# Patient Record
Sex: Male | Born: 1975 | Race: Black or African American | Hispanic: No | Marital: Single | Smoking: Former smoker
Health system: Southern US, Community
[De-identification: ages and names within clinical notes are randomized; demographics above are authoritative.]

## PROBLEM LIST (undated history)

## (undated) HISTORY — PX: VASECTOMY: SHX75

---

## 2017-07-31 ENCOUNTER — Ambulatory Visit (HOSPITAL_COMMUNITY)
Admission: EM | Admit: 2017-07-31 | Discharge: 2017-07-31 | Disposition: A | Discharge status: Home / Self Care | Payer: BLUE CROSS/BLUE SHIELD | Attending: Family Medicine | Admitting: Family Medicine

## 2017-07-31 ENCOUNTER — Encounter (HOSPITAL_COMMUNITY): Payer: Self-pay | Admitting: Emergency Medicine

## 2017-07-31 DIAGNOSIS — R109 Unspecified abdominal pain: Secondary | ICD-10-CM | POA: Diagnosis not present

## 2017-07-31 DIAGNOSIS — R1031 Right lower quadrant pain: Secondary | ICD-10-CM | POA: Diagnosis not present

## 2017-07-31 LAB — POCT URINALYSIS DIP (DEVICE)
BILIRUBIN URINE: NEGATIVE
GLUCOSE, UA: NEGATIVE mg/dL
HGB URINE DIPSTICK: NEGATIVE
Ketones, ur: NEGATIVE mg/dL
LEUKOCYTES UA: NEGATIVE
NITRITE: NEGATIVE
Protein, ur: NEGATIVE mg/dL
Specific Gravity, Urine: 1.015 (ref 1.005–1.030)
Urobilinogen, UA: 0.2 mg/dL (ref 0.0–1.0)
pH: 7 (ref 5.0–8.0)

## 2017-07-31 MED ORDER — BACITRACIN ZINC 500 UNIT/GM EX OINT
TOPICAL_OINTMENT | CUTANEOUS | Status: AC
  Filled 2017-07-31: qty 0.9
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
## 2017-07-31 NOTE — ED Triage Notes (Signed)
Intermittent abdominal pain on low right.  Has had this pain for 2 weeks.  Today pain is much worse.  Denies urinary symptoms .  Normal bm this morning.  Patient does not relate worsening or improvement of pain to any behavior

## 2017-07-31 NOTE — Discharge Instructions (Signed)
Urine negative for infection. Right-sided pain could be due to muscle strain, start ibuprofen 800 mg 3 times a day for the next 10 days. Ice/heat compresses. Monitor for any worsening of symptoms, increased abdominal pain, nausea, vomiting, fever, follow-up at the emergency department for further evaluation.

## 2019-07-30 ENCOUNTER — Encounter (HOSPITAL_COMMUNITY): Payer: Self-pay

## 2019-07-30 ENCOUNTER — Other Ambulatory Visit: Payer: Self-pay

## 2019-07-30 ENCOUNTER — Emergency Department (HOSPITAL_COMMUNITY)
Admission: EM | Admit: 2019-07-30 | Discharge: 2019-07-30 | Discharge status: Against Medical Advice | Payer: Self-pay | Attending: Emergency Medicine | Admitting: Emergency Medicine

## 2019-07-30 ENCOUNTER — Ambulatory Visit (HOSPITAL_COMMUNITY)
Admission: EM | Admit: 2019-07-30 | Discharge: 2019-07-30 | Disposition: A | Discharge status: Home / Self Care | Payer: Self-pay | Attending: Family Medicine | Admitting: Family Medicine

## 2019-07-30 ENCOUNTER — Emergency Department (HOSPITAL_COMMUNITY): Payer: Self-pay

## 2019-07-30 DIAGNOSIS — R030 Elevated blood-pressure reading, without diagnosis of hypertension: Secondary | ICD-10-CM

## 2019-07-30 DIAGNOSIS — S96911A Strain of unspecified muscle and tendon at ankle and foot level, right foot, initial encounter: Secondary | ICD-10-CM

## 2019-07-30 DIAGNOSIS — Z5321 Procedure and treatment not carried out due to patient leaving prior to being seen by health care provider: Secondary | ICD-10-CM | POA: Insufficient documentation

## 2019-07-30 MED ORDER — NAPROXEN 500 MG PO TABS: 500.0000 mg | ORAL_TABLET | Freq: Two times a day (BID) | ORAL | 0 refills | Status: DC
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
## 2019-07-30 NOTE — Discharge Instructions (Signed)
Your blood pressure was noted to be elevated during your visit today. You may return here within the next few days to recheck if unable to see your primary care doctor. ° °
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
## 2019-07-30 NOTE — ED Notes (Signed)
No answer for vitals x 3  
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
## 2019-08-04 ENCOUNTER — Other Ambulatory Visit: Payer: Self-pay

## 2019-08-04 ENCOUNTER — Encounter (HOSPITAL_COMMUNITY): Payer: Self-pay

## 2019-08-04 ENCOUNTER — Ambulatory Visit (HOSPITAL_COMMUNITY)
Admission: EM | Admit: 2019-08-04 | Discharge: 2019-08-04 | Disposition: A | Discharge status: Home / Self Care | Payer: Self-pay | Attending: Emergency Medicine | Admitting: Emergency Medicine

## 2019-08-04 DIAGNOSIS — M79671 Pain in right foot: Secondary | ICD-10-CM

## 2019-08-04 NOTE — Discharge Instructions (Addendum)
Continue to take the naproxen as needed.    Wear the orthopedic shoe as needed for comfort.    Follow-up with the orthopedic listed below if you continue to have discomfort or other symptoms.
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
## 2019-08-04 NOTE — ED Triage Notes (Signed)
Patient presents to Urgent Care with complaints of continued right foot pain since last week. Patient reports he is back because his foot still hurts, wants to know if he can have a boot for his foot.

## 2019-08-12 ENCOUNTER — Ambulatory Visit (INDEPENDENT_AMBULATORY_CARE_PROVIDER_SITE_OTHER): Payer: Self-pay | Admitting: Orthopaedic Surgery

## 2019-08-12 ENCOUNTER — Other Ambulatory Visit: Payer: Self-pay

## 2019-08-12 ENCOUNTER — Ambulatory Visit (INDEPENDENT_AMBULATORY_CARE_PROVIDER_SITE_OTHER): Payer: Self-pay

## 2019-08-12 ENCOUNTER — Encounter: Payer: Self-pay | Admitting: Orthopaedic Surgery

## 2019-08-12 DIAGNOSIS — M79671 Pain in right foot: Secondary | ICD-10-CM

## 2019-08-12 MED ORDER — DICLOFENAC SODIUM 50 MG PO TBEC: DELAYED_RELEASE_TABLET | ORAL | 1 refills | Status: AC

## 2019-08-12 MED ORDER — TRAMADOL HCL 50 MG PO TABS: ORAL_TABLET | ORAL | 0 refills | Status: AC
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
## 2019-08-18 ENCOUNTER — Telehealth: Payer: Self-pay | Admitting: Orthopaedic Surgery

## 2019-08-18 NOTE — Telephone Encounter (Signed)
ERROR

## 2019-08-26 ENCOUNTER — Ambulatory Visit (INDEPENDENT_AMBULATORY_CARE_PROVIDER_SITE_OTHER): Payer: Self-pay | Admitting: Orthopaedic Surgery

## 2019-08-26 ENCOUNTER — Other Ambulatory Visit: Payer: Self-pay

## 2019-08-26 DIAGNOSIS — M79671 Pain in right foot: Secondary | ICD-10-CM

## 2019-08-26 NOTE — Progress Notes (Signed)
   Office Visit Note   Patient: Derrick Black           Date of Birth: 09-30-1976           MRN: [id number] Visit Date: 08/26/2019              Requested by: Medical, [place] No address on file PCP: Medical, [place]: Visit Diagnoses:  1. Pain in right foot     Plan: At this point patient has recovered well from his toe sprain.  He is released back to work tomorrow without restrictions.  Follow-up as needed.  Follow-Up Instructions: Return if symptoms worsen or fail to improve.   Orders:  No orders of the defined types were placed in this encounter.  No orders of the defined types were placed in this encounter.     Procedures: No procedures performed   Clinical Data: No additional findings.   Subjective: Chief Complaint  Patient presents with  . Right Foot - Pain    Derrick Black returns today for his right great toe sprain.  He is doing well overall.  He has discontinued the postop shoe on his own.  He has no symptoms.  He is ready to go back to work.   Review of Systems   Objective: Vital Signs: There were no vitals taken for this visit.  Physical Exam  Ortho Exam Right foot exam is benign.  He has no pain with great toe range of motion.  No swelling. Specialty Comments:  No specialty comments available.  Imaging: No results found.   PMFS History: There are no active problems to display for this patient.  No past medical history on file.  Family History  Problem Relation Age of Onset  . Healthy Mother   . Healthy Father     Past Surgical History:  Procedure Laterality Date  . VASECTOMY     Social History   Occupational History  . Not on file  Tobacco Use  . Smoking status: Current Some Day Smoker    Types: Cigarettes  . Smokeless tobacco: Never Used  Substance and Sexual Activity  . Alcohol use: Yes  . Drug use: No  . Sexual activity: Not on file

## 2019-11-16 ENCOUNTER — Encounter (HOSPITAL_COMMUNITY): Payer: Self-pay

## 2019-11-16 ENCOUNTER — Ambulatory Visit (HOSPITAL_COMMUNITY)
Admission: EM | Admit: 2019-11-16 | Discharge: 2019-11-16 | Disposition: A | Discharge status: Home / Self Care | Payer: Self-pay | Attending: Family Medicine | Admitting: Family Medicine

## 2019-11-16 ENCOUNTER — Other Ambulatory Visit: Payer: Self-pay

## 2019-11-16 DIAGNOSIS — Z202 Contact with and (suspected) exposure to infections with a predominantly sexual mode of transmission: Secondary | ICD-10-CM | POA: Insufficient documentation

## 2019-11-16 LAB — HIV ANTIBODY (ROUTINE TESTING W REFLEX): HIV Screen 4th Generation wRfx: NONREACTIVE

## 2019-11-16 MED ORDER — METRONIDAZOLE 500 MG PO TABS
2000.0000 mg | ORAL_TABLET | Freq: Once | ORAL | Status: AC
  Administered 2019-11-16: 12:00:00 2000 mg via ORAL

## 2019-11-16 MED ORDER — METRONIDAZOLE 500 MG PO TABS
ORAL_TABLET | ORAL | Status: AC
Start: 2019-11-16 — End: ?
  Filled 2019-11-16: qty 4

## 2019-11-16 NOTE — ED Triage Notes (Signed)
Pt his girlfriend told him she had Trich. Pt states he needs to be treated for STD.

## 2019-11-16 NOTE — Discharge Instructions (Addendum)
We  have treated you for trichomonas We have tested you for other infections You will be called if any of your tests are positive

## 2019-11-16 NOTE — ED Provider Notes (Signed)
[facility]    CSN: [id number] Arrival date & time: 11/16/19  1041      History   Chief Complaint Chief Complaint  Patient presents with  . SEXUALLY TRANSMITTED DISEASE    HPI Derrick Black is a 43 y.o. male.   HPI  Patient received a call from ex-girlfriend that she has trichomonas. Patient has no symptoms.  He desires testing and treatment History reviewed. No pertinent past medical history.  There are no problems to display for this patient.   Past Surgical History:  Procedure Laterality Date  . VASECTOMY         Home Medications    Prior to Admission medications   Medication Sig Start Date End Date Taking? Authorizing Provider  diclofenac (VOLTAREN) 50 MG EC tablet Take one tab po bid x 2 weeks and then bid prn pain 08/12/19   Aundra Dubin, PA-C  naproxen (NAPROSYN) 500 MG tablet Take 1 tablet (500 mg total) by mouth 2 (two) times daily with a meal. 07/30/19   Vanessa Kick, MD  traMADol Veatrice Bourbon) 50 MG tablet Take one tab po tid prn pain 08/12/19   Aundra Dubin, PA-C    Family History Family History  Problem Relation Age of Onset  . Healthy Mother   . Healthy Father     Social History Social History   Tobacco Use  . Smoking status: Current Some Day Smoker    Types: Cigarettes  . Smokeless tobacco: Never Used  Substance Use Topics  . Alcohol use: Yes  . Drug use: No     Allergies   Penicillins   Review of Systems Review of Systems  Constitutional: Negative for chills and fever.  HENT: Negative for congestion and hearing loss.   Eyes: Negative for pain.  Respiratory: Negative for cough and shortness of breath.   Cardiovascular: Negative for chest pain and leg swelling.  Gastrointestinal: Negative for abdominal pain, constipation and diarrhea.  Genitourinary: Negative for dysuria and frequency.  Musculoskeletal: Negative for myalgias.  Neurological: Negative for dizziness, seizures and headaches.    Psychiatric/Behavioral: The patient is not nervous/anxious.      Physical Exam Triage Vital Signs ED Triage Vitals  Enc Vitals Group     BP 11/16/19 1114 (!) 158/86     Pulse Rate 11/16/19 1114 87     Resp 11/16/19 1114 19     Temp 11/16/19 1114 98.2 F (36.8 C)     Temp Source 11/16/19 1114 Oral     SpO2 11/16/19 1114 100 %     Weight 11/16/19 1112 280 lb (127 kg)     Height --      Head Circumference --      Peak Flow --      Pain Score 11/16/19 1111 0     Pain Loc --      Pain Edu? --      Excl. in [place]? --    No data found.  Updated Vital Signs BP (!) 158/86 (BP Location: Right Arm)   Pulse 87   Temp 98.2 F (36.8 C) (Oral)   Resp 19   Wt 127 kg   SpO2 100%   BMI 36.94 kg/m :     Physical Exam Constitutional:      General: He is not in acute distress.    Appearance: He is well-developed.     Comments: Obese  HENT:     Head: Normocephalic and atraumatic.  Eyes:     Conjunctiva/sclera:  Conjunctivae normal.     Pupils: Pupils are equal, round, and reactive to light.  Cardiovascular:     Rate and Rhythm: Normal rate.  Pulmonary:     Effort: Pulmonary effort is normal. No respiratory distress.  Abdominal:     General: There is no distension.     Palpations: Abdomen is soft.  Genitourinary:    Penis: Normal.      Comments: Normal circumcised penis.  No discharge Musculoskeletal:        General: Normal range of motion.     Cervical back: Normal range of motion.  Skin:    General: Skin is warm and dry.  Neurological:     General: No focal deficit present.     Mental Status: He is alert.  Psychiatric:        Mood and Affect: Mood normal.        Behavior: Behavior normal.      UC Treatments / Results  Labs (all labs ordered are listed, but only abnormal results are displayed) Labs Reviewed  RPR  HIV ANTIBODY (ROUTINE TESTING W REFLEX)  CYTOLOGY, (ORAL, ANAL, URETHRAL) ANCILLARY ONLY    EKG   Radiology No results  found.  Procedures Procedures (including critical care time)  Medications Ordered in UC Medications  metroNIDAZOLE (FLAGYL) tablet 2,000 mg (has no administration in time range)    Initial Impression / Assessment and Plan / UC Course  I have reviewed the triage vital signs and the nursing notes.  Pertinent labs & imaging results that were available during my care of the patient were reviewed by me and considered in my medical decision making (see chart for details).    Discussed trichomonas.  Safe sex. Final Clinical Impressions(s) / UC Diagnoses   Final diagnoses:  Exposure to STD     Discharge Instructions     We  have treated you for trichomonas We have tested you for other infections You will be called if any of your tests are positive   ED Prescriptions    None     PDMP not reviewed this encounter.   Eustace Moore, MD 11/16/19 1140

## 2019-11-17 LAB — RPR: RPR Ser Ql: NONREACTIVE

## 2019-11-18 ENCOUNTER — Telehealth: Payer: Self-pay | Admitting: Emergency Medicine

## 2019-11-18 LAB — CYTOLOGY, (ORAL, ANAL, URETHRAL) ANCILLARY ONLY
Chlamydia: NEGATIVE
Neisseria Gonorrhea: NEGATIVE
Trichomonas: POSITIVE — AB

## 2019-11-18 NOTE — Telephone Encounter (Signed)
Trichomonas is positive. Rx metronidazole was given at the urgent care visit. please refrain from sexual intercourse for 7 days to give the medicine time to work. Sexual partners need to be notified and tested/treated. Condoms may reduce risk of reinfection. Recheck for further evaluation if symptoms are not improving.   Attempted to reach patient. No answer at this time. No voicemail set up.

## 2019-12-05 ENCOUNTER — Ambulatory Visit: Payer: 59 | Attending: Internal Medicine

## 2019-12-05 DIAGNOSIS — Z20822 Contact with and (suspected) exposure to covid-19: Secondary | ICD-10-CM

## 2019-12-07 LAB — NOVEL CORONAVIRUS, NAA: SARS-CoV-2, NAA: NOT DETECTED
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
## 2022-02-15 ENCOUNTER — Encounter: Payer: Self-pay | Admitting: Nurse Practitioner

## 2022-02-15 ENCOUNTER — Other Ambulatory Visit: Payer: Self-pay

## 2022-02-15 ENCOUNTER — Ambulatory Visit (INDEPENDENT_AMBULATORY_CARE_PROVIDER_SITE_OTHER): Payer: Self-pay | Admitting: Nurse Practitioner

## 2022-02-15 VITALS — BP 157/102 | HR 97 | Temp 98.5°F | Ht 72.0 in | Wt 313.8 lb

## 2022-02-15 DIAGNOSIS — Z131 Encounter for screening for diabetes mellitus: Secondary | ICD-10-CM

## 2022-02-15 DIAGNOSIS — I10 Essential (primary) hypertension: Secondary | ICD-10-CM

## 2022-02-15 DIAGNOSIS — E119 Type 2 diabetes mellitus without complications: Secondary | ICD-10-CM

## 2022-02-15 LAB — POCT GLYCOSYLATED HEMOGLOBIN (HGB A1C)
HbA1c POC (<> result, manual entry): 7.8 % (ref 4.0–5.6)
HbA1c, POC (controlled diabetic range): 7.8 % — AB (ref 0.0–7.0)
HbA1c, POC (prediabetic range): 7.8 % — AB (ref 5.7–6.4)
Hemoglobin A1C: 7.8 % — AB (ref 4.0–5.6)

## 2022-02-15 MED ORDER — METFORMIN HCL 500 MG PO TABS: 500.0000 mg | ORAL_TABLET | Freq: Every day | ORAL | 3 refills | Status: DC

## 2022-02-15 MED ORDER — AMLODIPINE BESYLATE 5 MG PO TABS: 5.0000 mg | ORAL_TABLET | Freq: Every day | ORAL | 0 refills | Status: DC

## 2022-02-15 NOTE — Progress Notes (Signed)
@Patient  ID: , male    DOB: 12/26/1975, 46 y.o.   MRN: [id number] ? ?Chief Complaint  ?Patient presents with  ? Establish Care  ?  Patient is here today to establish care and to discuss his hospital visit on yesterday. ?Patient was seen in the ED due to having headaches associated with dizziness and syncope. Patient also states that his blood pressure was also elevated when he was at the ED as well but the provider told him everything was normal. No head CT scans were done during his ED visit.  ? ? ?Referring provider: ?Medical, [facility] * ? ?HPI ? ?Patient presents today for ED follow up and to establish care. Patient was seen in the ED at [facility] yesterday for headache and syncope. All workup including cardiac and CT were overall normal. Blood sugar and blood pressure were both elevated. Patient advised to get a PCP for management. Blood pressure elevated in office today. HGB A1C 7.8 today. Patient will be placed on medications for diabetes and hypertension. Will refer for diabetic teaching. Handouts given on these health conditions. Denies f/c/s, n/v/d, hemoptysis, PND, chest pain or edema. ? ? ? ? ?Allergies  ?Allergen Reactions  ? Penicillins   ? ? ? ?There is no immunization history on file for this patient. ? ?History reviewed. No pertinent past medical history. ? ?Tobacco History: ?Social History  ? ?Tobacco Use  ?Smoking Status Former  ? Types: Cigarettes  ?Smokeless Tobacco Never  ? ?Counseling given: Not Answered ? ? ?Outpatient Encounter Medications as of 02/15/2022  ?Medication Sig  ? amLODipine (NORVASC) 5 MG tablet Take 1 tablet (5 mg total) by mouth daily.  ? metFORMIN (GLUCOPHAGE) 500 MG tablet Take 1 tablet (500 mg total) by mouth daily with breakfast.  ? diclofenac (VOLTAREN) 50 MG EC tablet Take one tab po bid x 2 weeks and then bid prn pain (Patient not taking: Reported on 02/15/2022)  ? traMADol (ULTRAM) 50 MG tablet Take one tab po tid prn pain (Patient not taking: Reported on  02/15/2022)  ? [DISCONTINUED] naproxen (NAPROSYN) 500 MG tablet Take 1 tablet (500 mg total) by mouth 2 (two) times daily with a meal.  ? ?No facility-administered encounter medications on file as of 02/15/2022.  ? ? ? ?Review of Systems ? ?Review of Systems  ?Constitutional: Negative.   ?HENT: Negative.    ?Cardiovascular: Negative.   ?Gastrointestinal: Negative.   ?Allergic/Immunologic: Negative.   ?Neurological: Negative.   ?Psychiatric/Behavioral: Negative.     ? ? ? ?Physical Exam ? ?BP (!) 157/102 (BP Location: Left Arm, Cuff Size: Large)   Pulse 97   Temp 98.5 ?F (36.9 ?C)   Ht 6' (1.829 m)   Wt (!) 313 lb 12.8 oz (142.3 kg)   SpO2 99%   BMI 42.56 kg/m?  ? ?Wt Readings from Last 5 Encounters:  ?02/15/22 (!) 313 lb 12.8 oz (142.3 kg)  ?11/16/19 280 lb (127 kg)  ?07/30/19 280 lb (127 kg)  ? ? ? ?Physical Exam ?Vitals and nursing note reviewed.  ?Constitutional:   ?   General: He is not in acute distress. ?   Appearance: He is well-developed.  ?Cardiovascular:  ?   Rate and Rhythm: Normal rate and regular rhythm.  ?Pulmonary:  ?   Effort: Pulmonary effort is normal.  ?   Breath sounds: Normal breath sounds.  ?Skin: ?   General: Skin is warm and dry.  ?Neurological:  ?   Mental Status: He is alert and oriented  to person, place, and time.  ? ? ? ? ?Assessment & Plan:  ? ?Type 2 diabetes mellitus without complication, without long-term current use of insulin (HCC) ?- HgB A1c ? ?2. Essential hypertension ? ?- amLODipine (NORVASC) 5 MG tablet; Take 1 tablet (5 mg total) by mouth daily.  Dispense: 30 tablet; Refill: 0 ? ?3. Type 2 diabetes mellitus without complication, without long-term current use of insulin (HCC) ? ?- metFORMIN (GLUCOPHAGE) 500 MG tablet; Take 1 tablet (500 mg total) by mouth daily with breakfast.  Dispense: 180 tablet; Refill: 3 ? ?Diabetic/low salt diet ? ?Follow up: ? ?Follow up for physical with Randy Castrejon in 2-4 weeks  ? ? ? ? ?Ivonne Andrew, NP ?02/15/2022 ? ?

## 2022-02-15 NOTE — Assessment & Plan Note (Signed)
-   HgB A1c ? ?2. Essential hypertension ? ?- amLODipine (NORVASC) 5 MG tablet; Take 1 tablet (5 mg total) by mouth daily.  Dispense: 30 tablet; Refill: 0 ? ?3. Type 2 diabetes mellitus without complication, without long-term current use of insulin ([place]) ? ?- metFORMIN (GLUCOPHAGE) 500 MG tablet; Take 1 tablet (500 mg total) by mouth daily with breakfast.  Dispense: 180 tablet; Refill: 3 ? ?Diabetic/low salt diet ? ?Follow up: ? ?Follow up for physical with Datha Kissinger in 2-4 weeks  ?

## 2022-02-15 NOTE — Patient Instructions (Addendum)
1. Diabetes mellitus screening ? ?- HgB A1c ? ?2. Essential hypertension ? ?- amLODipine (NORVASC) 5 MG tablet; Take 1 tablet (5 mg total) by mouth daily.  Dispense: 30 tablet; Refill: 0 ? ?3. Type 2 diabetes mellitus without complication, without long-term current use of insulin (HCC) ? ?- metFORMIN (GLUCOPHAGE) 500 MG tablet; Take 1 tablet (500 mg total) by mouth daily with breakfast.  Dispense: 180 tablet; Refill: 3 ? ?Diabetic/low salt diet ? ?Follow up: ? ?Follow up for physical with Atlantis Delong in 2-4 weeks  ? ?Living With Diabetes ?Diabetes (type 1 diabetes mellitus or type 2 diabetes mellitus) is a condition in which the body does not have enough of a hormone called insulin, or the body does not respond properly to insulin. Normally, insulin allows sugars (glucose) to enter cells in the body. With diabetes, extra glucose builds up in the blood instead of going into cells. This results in high blood glucose (hyperglycemia). ?How to manage lifestyle changes ?Managing diabetes includes medical treatments as well as lifestyle changes. If diabetes is not managed well, serious physical and emotional complications can occur. Taking good care of yourself means that you are responsible for: ?Monitoring glucose regularly. ?Eating a healthy diet. ?Exercising regularly. ?Meeting with health care providers. ?Taking medicines as directed. ?Most people feel some stress about managing their diabetes. When this stress becomes too much, it is known as diabetes-related distress. This is very common. Living with diabetes can place you at risk for diabetes distress, depression, or anxiety. These disorders can make diabetes more difficult to manage. ?How to recognize stress ?You may have diabetes distress if you: ?Avoid or ignore your daily diabetes care. This includes glucose testing, following a meal plan, and taking medications. ?Feel overwhelmed by your daily diabetes care. ?Experience emotional reactions such as anger, sadness, or  fear related to your daily diabetes care. ?Feel fear or shame about not doing everything perfectly that you have been told to do. ?Emotional distress ?Symptoms of diabetes distress include: ?Anger about having a diagnosis of diabetes. ?Fear or frustration about your diagnosis and the changes you need to make to manage the condition. ?Being overly worried about the care that you need or the cost of the care that you need. ?Feeling like you caused your condition by doing something wrong. ?Fear about unpredictable fluctuations in your blood glucose, like low or high blood glucose. ?Feeling judged by your health care providers. ?Feeling very alone with the disease. ?Depression ?Having diabetes means that you are at a higher risk for depression. Your health care provider may test (screen) you for symptoms of depression. It is important to recognize symptoms and to start treatment for depression soon after it is diagnosed. The following are some symptoms of depression: ?Loss of interest in things that you used to enjoy. ?Feeling depressed much or most of the time. ?A change in appetite. ?Trouble getting to sleep or staying asleep. ?Feeling tired most of the day. ?Feeling nervous and anxious. ?Feeling guilty and worrying that you are a burden to others. ?Having thoughts of hurting yourself or feeling that you want to die. ?If you have any of these symptoms, more days than not, for 2 weeks or longer, you may have depression. This would be a good time to contact your health care provider. ?Follow these instructions at home: ?Managing diabetes distress ?The following are some ways to manage emotional distress: ?Learn as much as you can about diabetes and its treatment. Take one step at a time to improve  your management. ?Meet with a certified diabetes care and education specialist. Take a class to learn how to manage your condition. ?Consider working with a Veterinary surgeon or therapist. ?Keep a journal of your thoughts and  concerns. ?Accept that some things are out of your control. ?Talk with other people who have diabetes. It can help to talk about the distress that you feel. ?Find ways to manage stress that work for you. These may include art or music therapy, exercise, meditation, and hobbies. ?Seek support from spiritual leaders, family, and friends. ? ?General instructions ?Do your best to follow your diabetes management plan. ?If you are struggling to follow your plan, talk with a certified diabetes care and education specialist, or with someone else who has diabetes. They may have ideas that will help. ?Forgive yourself for not being perfect. Almost everyone struggles with the tasks of diabetes. ?Keep all follow-up visits. This is important. ?Where to find support ?Search for information and support from the American Diabetes Association: www.diabetes.org ?Find a certified diabetes education and care specialist. Make an appointment through the Association of Diabetes Care & Education Specialists: www.diabeteseducator.org ?Contact a health care provider if: ?You believe your diabetes is getting out of control. ?You are concerned you may be depressed. ?You think your medications are not helping control your diabetes. ?You are feeling overwhelmed with your diabetes. ?Get help right away if: ?You have thoughts about hurting yourself or others. ?If you ever feel like you may hurt yourself or others, or have thoughts about taking your own life, get help right away. You can go to your nearest emergency department or call: ?Your local emergency services (911 in the [place] ?A suicide crisis helpline, such as the National Suicide Prevention Lifeline at (779)388-5747 or 988 in the [place] This is open 24 hours a day. ?Summary ?Diabetes (type 1 diabetes mellitus or type 2 diabetes mellitus) is a condition in which the body does not have enough of a hormone called insulin, or the body does not respond properly to insulin. ?Living with  diabetes puts you at risk for medical and emotional issues, such as diabetes distress, depression, and anxiety. ?Recognizing the symptoms of diabetes distress and depression may help you avoid problems with your diabetes control. If you experience symptoms, it is important to discuss this with your health care provider, certified diabetes care and education specialist, or therapist. ?It is important to start treatment for diabetes distress and depression soon after diagnosis. ?Ask your health care provider to recommend a therapist who understands both depression and diabetes. ?This information is not intended to replace advice given to you by your health care provider. Make sure you discuss any questions you have with your health care provider. ?Document Revised: 06/08/2021 Document Reviewed: 03/25/2020 ?Elsevier Patient Education ? 2022 [employer] ? ? ? ?DASH Eating Plan ?DASH stands for Dietary Approaches to Stop Hypertension. The DASH eating plan is a healthy eating plan that has been shown to: ?Reduce high blood pressure (hypertension). ?Reduce your risk for type 2 diabetes, heart disease, and stroke. ?Help with weight loss. ?What are tips for following this plan? ?Reading food labels ?Check food labels for the amount of salt (sodium) per serving. Choose foods with less than 5 percent of the Daily Value of sodium. Generally, foods with less than 300 milligrams (mg) of sodium per serving fit into this eating plan. ?To find whole grains, look for the word "whole" as the first word in the ingredient list. ?Shopping ?Buy products labeled as "low-sodium" or "  no salt added." ?Buy fresh foods. Avoid canned foods and pre-made or frozen meals. ?Cooking ?Avoid adding salt when cooking. Use salt-free seasonings or herbs instead of table salt or sea salt. Check with your health care provider or pharmacist before using salt substitutes. ?Do not fry foods. Cook foods using healthy methods such as baking, boiling, grilling,  roasting, and broiling instead. ?Cook with heart-healthy oils, such as olive, canola, avocado, soybean, or sunflower oil. ?Meal planning ? ?Eat a balanced diet that includes: ?4 or more servings of fruits and 4 or

## 2022-02-20 ENCOUNTER — Encounter: Payer: Self-pay | Attending: Nurse Practitioner | Admitting: Dietician

## 2022-03-01 ENCOUNTER — Encounter: Payer: Self-pay | Admitting: Nurse Practitioner

## 2022-03-01 ENCOUNTER — Ambulatory Visit (INDEPENDENT_AMBULATORY_CARE_PROVIDER_SITE_OTHER): Payer: Self-pay | Admitting: Nurse Practitioner

## 2022-03-01 VITALS — BP 151/104 | HR 104 | Temp 98.3°F | Ht 71.0 in | Wt 313.5 lb

## 2022-03-01 DIAGNOSIS — I10 Essential (primary) hypertension: Secondary | ICD-10-CM

## 2022-03-01 MED ORDER — AMLODIPINE BESYLATE 10 MG PO TABS: 10.0000 mg | ORAL_TABLET | Freq: Every day | ORAL | 0 refills | Status: DC

## 2022-03-01 NOTE — Assessment & Plan Note (Signed)
Will increase blood pressure medication ? ?- amLODipine (NORVASC) 10 MG tablet; Take 1 tablet (10 mg total) by mouth daily.  Dispense: 30 tablet; Refill: 0 ? ?Follow up: ? ?Follow up in 4 weeks for hypertension ?

## 2022-03-01 NOTE — Patient Instructions (Signed)
1. Essential hypertension ? ?Will increase blood pressure medication ? ?- amLODipine (NORVASC) 10 MG tablet; Take 1 tablet (10 mg total) by mouth daily.  Dispense: 30 tablet; Refill: 0 ? ?Follow up: ? ?Follow up in 4 weeks for hypertension ? ? ?Hypertension, Adult ?Hypertension is another name for high blood pressure. High blood pressure forces your heart to work harder to pump blood. This can cause problems over time. ?There are two numbers in a blood pressure reading. There is a top number (systolic) over a bottom number (diastolic). It is best to have a blood pressure that is below 120/80. Healthy choices can help lower your blood pressure, or you may need medicine to help lower it. ?What are the causes? ?The cause of this condition is not known. Some conditions may be related to high blood pressure. ?What increases the risk? ?Smoking. ?Having type 2 diabetes mellitus, high cholesterol, or both. ?Not getting enough exercise or physical activity. ?Being overweight. ?Having too much fat, sugar, calories, or salt (sodium) in your diet. ?Drinking too much alcohol. ?Having long-term (chronic) kidney disease. ?Having a family history of high blood pressure. ?Age. Risk increases with age. ?Race. You may be at higher risk if you are African American. ?Gender. Men are at higher risk than women before age 64. After age 60, women are at higher risk than men. ?Having obstructive sleep apnea. ?Stress. ?What are the signs or symptoms? ?High blood pressure may not cause symptoms. Very high blood pressure (hypertensive crisis) may cause: ?Headache. ?Feelings of worry or nervousness (anxiety). ?Shortness of breath. ?Nosebleed. ?A feeling of being sick to your stomach (nausea). ?Throwing up (vomiting). ?Changes in how you see. ?Very bad chest pain. ?Seizures. ?How is this treated? ?This condition is treated by making healthy lifestyle changes, such as: ?Eating healthy foods. ?Exercising more. ?Drinking less alcohol. ?Your health  care provider may prescribe medicine if lifestyle changes are not enough to get your blood pressure under control, and if: ?Your top number is above 130. ?Your bottom number is above 80. ?Your personal target blood pressure may vary. ?Follow these instructions at home: ?Eating and drinking ? ?If told, follow the DASH eating plan. To follow this plan: ?Fill one half of your plate at each meal with fruits and vegetables. ?Fill one fourth of your plate at each meal with whole grains. Whole grains include whole-wheat pasta, brown rice, and whole-grain bread. ?Eat or drink low-fat dairy products, such as skim milk or low-fat yogurt. ?Fill one fourth of your plate at each meal with low-fat (lean) proteins. Low-fat proteins include fish, chicken without skin, eggs, beans, and tofu. ?Avoid fatty meat, cured and processed meat, or chicken with skin. ?Avoid pre-made or processed food. ?Eat less than 1,500 mg of salt each day. ?Do not drink alcohol if: ?Your doctor tells you not to drink. ?You are pregnant, may be pregnant, or are planning to become pregnant. ?If you drink alcohol: ?Limit how much you use to: ?0-1 drink a day for women. ?0-2 drinks a day for men. ?Be aware of how much alcohol is in your drink. In the [place] one drink equals one 12 oz bottle of beer (355 mL), one 5 oz glass of wine (148 mL), or one 1? oz glass of hard liquor (44 mL). ?Lifestyle ? ?Work with your doctor to stay at a healthy weight or to lose weight. Ask your doctor what the best weight is for you. ?Get at least 30 minutes of exercise most days of the week. This  may include walking, swimming, or biking. ?Get at least 30 minutes of exercise that strengthens your muscles (resistance exercise) at least 3 days a week. This may include lifting weights or doing Pilates. ?Do not use any products that contain nicotine or tobacco, such as cigarettes, e-cigarettes, and chewing tobacco. If you need help quitting, ask your doctor. ?Check your blood pressure  at home as told by your doctor. ?Keep all follow-up visits as told by your doctor. This is important. ?Medicines ?Take over-the-counter and prescription medicines only as told by your doctor. Follow directions carefully. ?Do not skip doses of blood pressure medicine. The medicine does not work as well if you skip doses. Skipping doses also puts you at risk for problems. ?Ask your doctor about side effects or reactions to medicines that you should watch for. ?Contact a doctor if you: ?Think you are having a reaction to the medicine you are taking. ?Have headaches that keep coming back (recurring). ?Feel dizzy. ?Have swelling in your ankles. ?Have trouble with your vision. ?Get help right away if you: ?Get a very bad headache. ?Start to feel mixed up (confused). ?Feel weak or numb. ?Feel faint. ?Have very bad pain in your: ?Chest. ?Belly (abdomen). ?Throw up more than once. ?Have trouble breathing. ?Summary ?Hypertension is another name for high blood pressure. ?High blood pressure forces your heart to work harder to pump blood. ?For most people, a normal blood pressure is less than 120/80. ?Making healthy choices can help lower blood pressure. If your blood pressure does not get lower with healthy choices, you may need to take medicine. ?This information is not intended to replace advice given to you by your health care provider. Make sure you discuss any questions you have with your health care provider. ?Document Revised: 07/24/2018 Document Reviewed: 07/24/2018 ?Elsevier Patient Education ? 2022 [employer]vier Inc. ? ? ?

## 2022-03-01 NOTE — Progress Notes (Signed)
@Patient  ID: Derrick Black, male    DOB: 29-Dec-1975, 46 y.o.   MRN: [id number] ? ?Chief Complaint  ?Patient presents with  ? Follow-up  ?  Pt stated is here for BP  ? ? ?Referring provider: ?Medical, [place] * ? ? ?HPI ? ?46 year old male with history of diabetes and hypertension. ? ?Patient presents today for follow-up on hypertension.  He states that he did take his medications this morning.  He was recently started on Norvasc 5 mg daily.  His blood pressure remains slightly elevated.  We discussed that we can and can increase his dosage today.  Patient does need to adhere to a strict low-salt diabetic diet.  He does need to start exercise routine. Denies f/c/s, n/v/d, hemoptysis, PND, chest pain or edema. ? ? ? ? ? ? ?Allergies  ?Allergen Reactions  ? Penicillins   ? ? ? ?There is no immunization history on file for this patient. ? ?History reviewed. No pertinent past medical history. ? ?Tobacco History: ?Social History  ? ?Tobacco Use  ?Smoking Status Former  ? Types: Cigarettes  ?Smokeless Tobacco Never  ? ?Counseling given: Not Answered ? ? ?Outpatient Encounter Medications as of 03/01/2022  ?Medication Sig  ? amLODipine (NORVASC) 10 MG tablet Take 1 tablet (10 mg total) by mouth daily.  ? metFORMIN (GLUCOPHAGE) 500 MG tablet Take 1 tablet (500 mg total) by mouth daily with breakfast.  ? [DISCONTINUED] amLODipine (NORVASC) 5 MG tablet Take 1 tablet (5 mg total) by mouth daily.  ? diclofenac (VOLTAREN) 50 MG EC tablet Take one tab po bid x 2 weeks and then bid prn pain (Patient not taking: Reported on 02/15/2022)  ? traMADol (ULTRAM) 50 MG tablet Take one tab po tid prn pain (Patient not taking: Reported on 02/15/2022)  ? ?No facility-administered encounter medications on file as of 03/01/2022.  ? ? ? ?Review of Systems ? ?Review of Systems  ?Constitutional: Negative.   ?HENT: Negative.    ?Cardiovascular: Negative.   ?Gastrointestinal: Negative.   ?Allergic/Immunologic: Negative.   ?Neurological:  Negative.   ?Psychiatric/Behavioral: Negative.     ? ? ? ?Physical Exam ? ?BP (!) 151/104 (BP Location: Left Arm, Patient Position: Sitting, Cuff Size: Large)   Pulse (!) 104   Temp 98.3 ?F (36.8 ?C)   Ht 5\' 11"  (1.803 m)   Wt (!) 313 lb 8 oz (142.2 kg)   SpO2 100%   BMI 43.72 kg/m?  ? ?Wt Readings from Last 5 Encounters:  ?03/01/22 (!) 313 lb 8 oz (142.2 kg)  ?02/15/22 (!) 313 lb 12.8 oz (142.3 kg)  ?11/16/19 280 lb (127 kg)  ?07/30/19 280 lb (127 kg)  ? ? ? ?Physical Exam ?Vitals and nursing note reviewed.  ?Constitutional:   ?   General: He is not in acute distress. ?   Appearance: He is well-developed.  ?Cardiovascular:  ?   Rate and Rhythm: Normal rate and regular rhythm.  ?Pulmonary:  ?   Effort: Pulmonary effort is normal.  ?   Breath sounds: Normal breath sounds.  ?Skin: ?   General: Skin is warm and dry.  ?Neurological:  ?   Mental Status: He is alert and oriented to person, place, and time.  ? ? ? ? ?Assessment & Plan:  ? ?Essential hypertension ?Will increase blood pressure medication ? ?- amLODipine (NORVASC) 10 MG tablet; Take 1 tablet (10 mg total) by mouth daily.  Dispense: 30 tablet; Refill: 0 ? ?Follow up: ? ?Follow up in 4 weeks for  hypertension ? ? ? ? ?Fenton Foy, NP ?03/01/2022 ? ?

## 2022-03-29 ENCOUNTER — Ambulatory Visit: Payer: Self-pay | Admitting: Nurse Practitioner

## 2022-05-08 ENCOUNTER — Ambulatory Visit: Payer: Self-pay | Admitting: Nurse Practitioner

## 2022-05-19 ENCOUNTER — Encounter: Payer: Self-pay | Admitting: Nurse Practitioner

## 2022-05-19 ENCOUNTER — Ambulatory Visit (INDEPENDENT_AMBULATORY_CARE_PROVIDER_SITE_OTHER): Payer: Self-pay | Admitting: Nurse Practitioner

## 2022-05-19 VITALS — BP 159/98 | HR 98 | Temp 98.6°F | Ht 71.0 in | Wt 314.4 lb

## 2022-05-19 DIAGNOSIS — R319 Hematuria, unspecified: Secondary | ICD-10-CM

## 2022-05-19 DIAGNOSIS — I10 Essential (primary) hypertension: Secondary | ICD-10-CM

## 2022-05-19 DIAGNOSIS — E119 Type 2 diabetes mellitus without complications: Secondary | ICD-10-CM

## 2022-05-19 DIAGNOSIS — R82998 Other abnormal findings in urine: Secondary | ICD-10-CM

## 2022-05-19 LAB — POCT GLYCOSYLATED HEMOGLOBIN (HGB A1C)
HbA1c POC (<> result, manual entry): 7.6 % (ref 4.0–5.6)
HbA1c, POC (controlled diabetic range): 7.6 % — AB (ref 0.0–7.0)
HbA1c, POC (prediabetic range): 7.6 % — AB (ref 5.7–6.4)
Hemoglobin A1C: 7.6 % — AB (ref 4.0–5.6)

## 2022-05-19 LAB — POCT URINALYSIS DIP (CLINITEK)
Bilirubin, UA: NEGATIVE
Blood, UA: NEGATIVE
Glucose, UA: 250 mg/dL — AB
Ketones, POC UA: NEGATIVE mg/dL
Nitrite, UA: NEGATIVE
POC PROTEIN,UA: 30 — AB
Spec Grav, UA: 1.02 (ref 1.010–1.025)
Urobilinogen, UA: 0.2 E.U./dL
pH, UA: 6 (ref 5.0–8.0)

## 2022-05-19 MED ORDER — AMLODIPINE BESYLATE 10 MG PO TABS: 10.0000 mg | ORAL_TABLET | Freq: Every day | ORAL | 0 refills | Status: DC

## 2022-05-19 MED ORDER — SULFAMETHOXAZOLE-TRIMETHOPRIM 800-160 MG PO TABS: 1.0000 | ORAL_TABLET | Freq: Two times a day (BID) | ORAL | 0 refills | Status: AC

## 2022-05-19 MED ORDER — METFORMIN HCL 500 MG PO TABS: 500.0000 mg | ORAL_TABLET | Freq: Every day | ORAL | 3 refills | Status: DC

## 2022-05-20 LAB — COMPREHENSIVE METABOLIC PANEL
ALT: 42 IU/L (ref 0–44)
AST: 33 IU/L (ref 0–40)
Albumin/Globulin Ratio: 1.5 (ref 1.2–2.2)
Albumin: 4.7 g/dL (ref 4.0–5.0)
Alkaline Phosphatase: 100 IU/L (ref 44–121)
BUN/Creatinine Ratio: 11 (ref 9–20)
BUN: 11 mg/dL (ref 6–24)
Bilirubin Total: 0.5 mg/dL (ref 0.0–1.2)
CO2: 21 mmol/L (ref 20–29)
Calcium: 10 mg/dL (ref 8.7–10.2)
Chloride: 98 mmol/L (ref 96–106)
Creatinine, Ser: 1.02 mg/dL (ref 0.76–1.27)
Globulin, Total: 3.1 g/dL (ref 1.5–4.5)
Glucose: 275 mg/dL — ABNORMAL HIGH (ref 70–99)
Potassium: 4.5 mmol/L (ref 3.5–5.2)
Sodium: 137 mmol/L (ref 134–144)
Total Protein: 7.8 g/dL (ref 6.0–8.5)
eGFR: 93 mL/min/{1.73_m2} (ref >59)

## 2022-05-20 LAB — CBC
Hematocrit: 41 % (ref 37.5–51.0)
Hemoglobin: 13.8 g/dL (ref 13.0–17.7)
MCH: 32 pg (ref 26.6–33.0)
MCHC: 33.7 g/dL (ref 31.5–35.7)
MCV: 95 fL (ref 79–97)
Platelets: 225 10*3/uL (ref 150–450)
RBC: 4.31 x10E6/uL (ref 4.14–5.80)
RDW: 13.9 % (ref 11.6–15.4)
WBC: 6 10*3/uL (ref 3.4–10.8)

## 2022-05-21 LAB — URINE CULTURE: Organism ID, Bacteria: NO GROWTH

## 2022-06-20 ENCOUNTER — Other Ambulatory Visit: Payer: Self-pay | Admitting: Nurse Practitioner

## 2022-06-20 DIAGNOSIS — I10 Essential (primary) hypertension: Secondary | ICD-10-CM

## 2022-06-20 MED ORDER — AMLODIPINE BESYLATE 10 MG PO TABS: 10.0000 mg | ORAL_TABLET | Freq: Every day | ORAL | 1 refills | Status: DC

## 2022-08-14 ENCOUNTER — Other Ambulatory Visit: Payer: Self-pay | Admitting: Nurse Practitioner

## 2022-08-15 ENCOUNTER — Other Ambulatory Visit: Payer: Self-pay | Admitting: Nurse Practitioner

## 2022-08-15 MED ORDER — INSULIN PEN NEEDLE 30G X 8 MM MISC: 1.0000 | 0 refills | Status: DC | PRN

## 2022-08-18 ENCOUNTER — Ambulatory Visit: Payer: Self-pay | Admitting: Nurse Practitioner

## 2022-08-25 ENCOUNTER — Ambulatory Visit (INDEPENDENT_AMBULATORY_CARE_PROVIDER_SITE_OTHER): Payer: BC Managed Care – PPO | Admitting: Nurse Practitioner

## 2022-08-25 ENCOUNTER — Encounter: Payer: Self-pay | Admitting: Nurse Practitioner

## 2022-08-25 VITALS — BP 145/97 | HR 95 | Temp 97.3°F | Ht 71.0 in | Wt 277.4 lb

## 2022-08-25 DIAGNOSIS — E119 Type 2 diabetes mellitus without complications: Secondary | ICD-10-CM | POA: Diagnosis not present

## 2022-08-25 MED ORDER — METFORMIN HCL 500 MG PO TABS: 500.0000 mg | ORAL_TABLET | Freq: Two times a day (BID) | ORAL | 2 refills | Status: DC

## 2022-08-25 NOTE — Assessment & Plan Note (Signed)
-   Lipid Panel - CBC - Comprehensive metabolic panel - Ambulatory referral to diabetic education  1) Levemir is a basal insulin (long acting insulin)- inject 35 units subcutaneously every morning. I ----If your evening blood sugars are consistently above 200, would recommend increasing the Levemir (the following morning) by 1 unit every other day.   2) Novolog is a rapid acting insulin that need to be taken before you eat to help cover the carbohydrates that you will consume- Inject 5 units with breakfast, lunch and supper.  ----Your doctor will help you adjust this dose based on your blood sugar trends and diet habits. It is important to ensure your carbohydrate intake is consistent with each meal and to limit to 40 grams per meal  In addition to the above regimen, you may require additional coverage for your meals based on your blood sugars right before the meal. This is called sliding scale using Novolog (short acting insulin): Check your blood sugars before meals:  BG 176-205 - administer 1 units BG 206-235 - administer 2 units BG 236-265 - administer 3 units BG 266-295 - administer 4 units BG 296-325 - administer 5 units BG 326-355 - administer 6 units BG 356-385 - administer 7 units BG 386-399 - administer 8 units BG 400 or greater - call provider  Other ways to help better control your diabetes include: diet, exercise and weight loss. These measures will help decrease your insulin requirement  Metformin will work with insulin to help improve your blood sugar. The dose has been increased to twice a day. Your doctor will help you titrate up the dosing to optimize it's benefit  *Your Triglycerides levels are very high, >2500. You MUST maintain RESTRICT dietary fat intake to less than 10% of your total calories with the goal of reducing your triglyceride level to less than 1000. At that point your doctor can start you on a triglyceride lowering medication. Until your triglyceride level  falls below 1000, medications are not very effective. With strict diet you can bring this level down.   Follow up:  Follow up in 3 months or sooner

## 2022-08-25 NOTE — Progress Notes (Signed)
@Patient  ID: , male    DOB: 1976-05-06, 46 y.o.   MRN: [id number]  Chief Complaint  Patient presents with   Follow-up    Referring provider: [id number], NP   HPI  Patient presents today for follow-up.  He was recently seen in the hospital for diabetes.  He is doing well with his diabetes at his last visit here.  Since that time he has went to [facility] [place] and has not been following a diabetic diet and has been drinking alcohol heavily.  He was released from the hospital on Levemir and NovoLog and metformin.  He has not been taking medications as directed.  We will consult pharmacist to follow-up with him for diabetic medication management.  He does need his triglycerides rechecked today they were critical high at last check in the hospital. Denies f/c/s, n/v/d, hemoptysis, PND, leg swelling Denies chest pain or edema      Allergies  Allergen Reactions   Penicillins      There is no immunization history on file for this patient.  History reviewed. No pertinent past medical history.  Tobacco History: Social History   Tobacco Use  Smoking Status Former   Types: Cigarettes  Smokeless Tobacco Never   Counseling given: Not Answered   Outpatient Encounter Medications as of 08/25/2022  Medication Sig   insulin aspart (NOVOLOG FLEXPEN) 100 UNIT/ML FlexPen Inject into the skin.   insulin detemir (LEVEMIR FLEXPEN) 100 UNIT/ML FlexPen Inject into the skin.   Insulin Pen Needle 32G X 4 MM MISC See admin instructions.   [DISCONTINUED] metFORMIN (GLUCOPHAGE) 500 MG tablet Take by mouth.   amLODipine (NORVASC) 10 MG tablet Take 1 tablet (10 mg total) by mouth daily.   diclofenac (VOLTAREN) 50 MG EC tablet Take one tab po bid x 2 weeks and then bid prn pain (Patient not taking: Reported on 02/15/2022)   metFORMIN (GLUCOPHAGE) 500 MG tablet Take 1 tablet (500 mg total) by mouth 2 (two) times daily with a meal.   traMADol (ULTRAM) 50 MG tablet Take one  tab po tid prn pain (Patient not taking: Reported on 02/15/2022)   [DISCONTINUED] Insulin Pen Needle (NOVOFINE) 30G X 8 MM MISC Inject 10 each into the skin as needed.   [DISCONTINUED] metFORMIN (GLUCOPHAGE) 500 MG tablet Take 1 tablet (500 mg total) by mouth daily with breakfast.   No facility-administered encounter medications on file as of 08/25/2022.     Review of Systems  Review of Systems  Constitutional: Negative.   HENT: Negative.    Cardiovascular: Negative.   Gastrointestinal: Negative.   Allergic/Immunologic: Negative.   Neurological: Negative.   Psychiatric/Behavioral: Negative.         Physical Exam  BP (!) 145/97 (BP Location: Left Arm, Patient Position: Sitting, Cuff Size: Large)   Pulse 95   Temp (!) 97.3 F (36.3 C)   Ht 5\' 11"  (1.803 m)   Wt 277 lb 6 oz (125.8 kg)   SpO2 100%   BMI 38.69 kg/m   Wt Readings from Last 5 Encounters:  08/25/22 277 lb 6 oz (125.8 kg)  05/19/22 (!) 314 lb 6.4 oz (142.6 kg)  03/01/22 (!) 313 lb 8 oz (142.2 kg)  02/15/22 (!) 313 lb 12.8 oz (142.3 kg)  11/16/19 280 lb (127 kg)     Physical Exam Vitals and nursing note reviewed.  Constitutional:      General: He is not in acute distress.    Appearance: He is well-developed.  Cardiovascular:     Rate and Rhythm: Normal rate and regular rhythm.  Pulmonary:     Effort: Pulmonary effort is normal.     Breath sounds: Normal breath sounds.  Skin:    General: Skin is warm and dry.  Neurological:     Mental Status: He is alert and oriented to person, place, and time.      Lab Results:  CBC    Component Value Date/Time   WBC 6.0 05/19/2022 0945   RBC 4.31 05/19/2022 0945   HGB 13.8 05/19/2022 0945   HCT 41.0 05/19/2022 0945   PLT 225 05/19/2022 0945   MCV 95 05/19/2022 0945   MCH 32.0 05/19/2022 0945   MCHC 33.7 05/19/2022 0945   RDW 13.9 05/19/2022 0945    BMET    Component Value Date/Time   NA 137 05/19/2022 0945   K 4.5 05/19/2022 0945   CL 98  05/19/2022 0945   CO2 21 05/19/2022 0945   GLUCOSE 275 (H) 05/19/2022 0945   BUN 11 05/19/2022 0945   CREATININE 1.02 05/19/2022 0945   CALCIUM 10.0 05/19/2022 0945    BNP No results found for: "BNP"  ProBNP No results found for: "PROBNP"  Imaging: No results found.   Assessment & Plan:   Type 2 diabetes mellitus without complication, without long-term current use of insulin (HCC) - Lipid Panel - CBC - Comprehensive metabolic panel - Ambulatory referral to diabetic education  1) Levemir is a basal insulin (long acting insulin)- inject 35 units subcutaneously every morning. I ----If your evening blood sugars are consistently above 200, would recommend increasing the Levemir (the following morning) by 1 unit every other day.   2) Novolog is a rapid acting insulin that need to be taken before you eat to help cover the carbohydrates that you will consume- Inject 5 units with breakfast, lunch and supper.  ----Your doctor will help you adjust this dose based on your blood sugar trends and diet habits. It is important to ensure your carbohydrate intake is consistent with each meal and to limit to 40 grams per meal  In addition to the above regimen, you may require additional coverage for your meals based on your blood sugars right before the meal. This is called sliding scale using Novolog (short acting insulin): Check your blood sugars before meals:  BG 176-205 - administer 1 units BG 206-235 - administer 2 units BG 236-265 - administer 3 units BG 266-295 - administer 4 units BG 296-325 - administer 5 units BG 326-355 - administer 6 units BG 356-385 - administer 7 units BG 386-399 - administer 8 units BG 400 or greater - call provider  Other ways to help better control your diabetes include: diet, exercise and weight loss. These measures will help decrease your insulin requirement  Metformin will work with insulin to help improve your blood sugar. The dose has been increased  to twice a day. Your doctor will help you titrate up the dosing to optimize it's benefit  *Your Triglycerides levels are very high, >2500. You MUST maintain RESTRICT dietary fat intake to less than 10% of your total calories with the goal of reducing your triglyceride level to less than 1000. At that point your doctor can start you on a triglyceride lowering medication. Until your triglyceride level falls below 1000, medications are not very effective. With strict diet you can bring this level down.   Follow up:  Follow up in 3 months or sooner     [place]  Aron Baba, NP 08/25/2022

## 2022-08-25 NOTE — Patient Instructions (Addendum)
1. Type 2 diabetes mellitus without complication, without long-term current use of insulin (HCC)  - Lipid Panel - CBC - Comprehensive metabolic panel - Ambulatory referral to diabetic education  1) Levemir is a basal insulin (long acting insulin)- inject 35 units subcutaneously every morning. I ----If your evening blood sugars are consistently above 200, would recommend increasing the Levemir (the following morning) by 1 unit every other day.   2) Novolog is a rapid acting insulin that need to be taken before you eat to help cover the carbohydrates that you will consume- Inject 5 units with breakfast, lunch and supper.  ----Your doctor will help you adjust this dose based on your blood sugar trends and diet habits. It is important to ensure your carbohydrate intake is consistent with each meal and to limit to 40 grams per meal  In addition to the above regimen, you may require additional coverage for your meals based on your blood sugars right before the meal. This is called sliding scale using Novolog (short acting insulin): Check your blood sugars before meals:  BG 176-205 - administer 1 units BG 206-235 - administer 2 units BG 236-265 - administer 3 units BG 266-295 - administer 4 units BG 296-325 - administer 5 units BG 326-355 - administer 6 units BG 356-385 - administer 7 units BG 386-399 - administer 8 units BG 400 or greater - call provider  Other ways to help better control your diabetes include: diet, exercise and weight loss. These measures will help decrease your insulin requirement  Metformin will work with insulin to help improve your blood sugar. The dose has been increased to twice a day. Your doctor will help you titrate up the dosing to optimize it's benefit  *Your Triglycerides levels are very high, >2500. You MUST maintain RESTRICT dietary fat intake to less than 10% of your total calories with the goal of reducing your triglyceride level to less than 1000. At that  point your doctor can start you on a triglyceride lowering medication. Until your triglyceride level falls below 1000, medications are not very effective. With strict diet you can bring this level down.   Follow up:  Follow up in 3 months or sooner

## 2022-08-29 LAB — COMPREHENSIVE METABOLIC PANEL
Albumin/Globulin Ratio: 1.6 (ref 1.2–2.2)
Albumin: 4.7 g/dL (ref 4.1–5.1)
Alkaline Phosphatase: 137 IU/L — ABNORMAL HIGH (ref 44–121)
BUN/Creatinine Ratio: 12 (ref 9–20)
BUN: 13 mg/dL (ref 6–24)
Bilirubin Total: 0.4 mg/dL (ref 0.0–1.2)
CO2: 19 mmol/L — ABNORMAL LOW (ref 20–29)
Calcium: 10.2 mg/dL (ref 8.7–10.2)
Creatinine, Ser: 1.09 mg/dL (ref 0.76–1.27)
Globulin, Total: 3 g/dL (ref 1.5–4.5)
Glucose: 372 mg/dL — ABNORMAL HIGH (ref 70–99)
Potassium: 4.3 mmol/L (ref 3.5–5.2)
Sodium: 135 mmol/L (ref 134–144)
Total Protein: 7.7 g/dL (ref 6.0–8.5)
eGFR: 86 mL/min/{1.73_m2} (ref >59)

## 2022-08-29 LAB — CBC
Hematocrit: 42.5 % (ref 37.5–51.0)
Hemoglobin: 15 g/dL (ref 13.0–17.7)
MCH: 33.3 pg — ABNORMAL HIGH (ref 26.6–33.0)
MCHC: 35.3 g/dL (ref 31.5–35.7)
MCV: 94 fL (ref 79–97)
Platelets: 223 10*3/uL (ref 150–450)
RBC: 4.5 x10E6/uL (ref 4.14–5.80)
RDW: 13.5 % (ref 11.6–15.4)
WBC: 6 10*3/uL (ref 3.4–10.8)

## 2022-08-29 LAB — LIPID PANEL
Chol/HDL Ratio: 13 ratio — ABNORMAL HIGH (ref 0.0–5.0)
Cholesterol, Total: 272 mg/dL — ABNORMAL HIGH (ref 100–199)
HDL: 21 mg/dL — ABNORMAL LOW (ref >39)
Triglycerides: 2037 mg/dL (ref 0–149)

## 2022-09-08 ENCOUNTER — Telehealth: Payer: Self-pay | Admitting: Nurse Practitioner

## 2022-09-08 DIAGNOSIS — E119 Type 2 diabetes mellitus without complications: Secondary | ICD-10-CM

## 2022-09-08 NOTE — Telephone Encounter (Signed)
Requesting prescription for diabetic test strips

## 2022-09-11 MED ORDER — ONETOUCH VERIO VI STRP: ORAL_STRIP | 12 refills | Status: AC

## 2022-09-11 NOTE — Telephone Encounter (Signed)
Sent!

## 2022-10-16 ENCOUNTER — Ambulatory Visit: Payer: BC Managed Care – PPO | Admitting: Registered"

## 2022-10-16 ENCOUNTER — Other Ambulatory Visit: Payer: Self-pay

## 2022-10-16 NOTE — Telephone Encounter (Signed)
From: Marella Chimes To: Office of [facility], NP Sent: 10/14/2022 1:30 AM EST Subject: Medication Renewal Request  Refills have been requested for the following medications:   insulin aspart (NOVOLOG FLEXPEN) 100 UNIT/ML FlexPen   insulin detemir (LEVEMIR FLEXPEN) 100 UNIT/ML FlexPen  Preferred pharmacy: [facility] [place] Delivery method: Daryll Drown

## 2022-10-17 ENCOUNTER — Other Ambulatory Visit (HOSPITAL_COMMUNITY): Payer: Self-pay

## 2022-10-17 MED ORDER — NOVOLOG FLEXPEN 100 UNIT/ML ~~LOC~~ SOPN
5.0000 [IU] | PEN_INJECTOR | Freq: Three times a day (TID) | SUBCUTANEOUS | 1 refills | Status: DC
  Filled 2022-10-17: qty 12, 80d supply, fill #0

## 2022-10-17 MED ORDER — LEVEMIR FLEXPEN 100 UNIT/ML ~~LOC~~ SOPN
35.0000 [IU] | PEN_INJECTOR | Freq: Every day | SUBCUTANEOUS | 1 refills | Status: DC
  Filled 2022-10-17: qty 15, 42d supply, fill #0

## 2022-10-18 ENCOUNTER — Other Ambulatory Visit (HOSPITAL_COMMUNITY): Payer: Self-pay

## 2022-11-29 ENCOUNTER — Ambulatory Visit: Payer: Self-pay | Admitting: Nurse Practitioner

## 2023-02-16 ENCOUNTER — Other Ambulatory Visit: Payer: Self-pay | Admitting: Pharmacist

## 2023-02-16 ENCOUNTER — Encounter: Payer: Self-pay | Admitting: Nurse Practitioner

## 2023-02-16 ENCOUNTER — Ambulatory Visit (INDEPENDENT_AMBULATORY_CARE_PROVIDER_SITE_OTHER): Payer: BC Managed Care – PPO | Admitting: Nurse Practitioner

## 2023-02-16 VITALS — BP 145/75 | HR 101 | Temp 97.7°F | Ht 72.0 in | Wt 297.4 lb

## 2023-02-16 DIAGNOSIS — I10 Essential (primary) hypertension: Secondary | ICD-10-CM | POA: Diagnosis not present

## 2023-02-16 DIAGNOSIS — E119 Type 2 diabetes mellitus without complications: Secondary | ICD-10-CM | POA: Diagnosis not present

## 2023-02-16 LAB — POCT GLYCOSYLATED HEMOGLOBIN (HGB A1C): Hemoglobin A1C: 7.5 % — AB (ref 4.0–5.6)

## 2023-02-16 LAB — POCT INFLUENZA A/B
Influenza A, POC: NEGATIVE
Influenza B, POC: NEGATIVE

## 2023-02-16 LAB — POC COVID19 BINAXNOW: SARS Coronavirus 2 Ag: NEGATIVE

## 2023-02-16 MED ORDER — METFORMIN HCL ER 500 MG PO TB24: 1000.0000 mg | ORAL_TABLET | Freq: Every day | ORAL | 2 refills | Status: DC

## 2023-02-16 MED ORDER — TRIAMCINOLONE ACETONIDE 0.1 % EX CREA: 1.0000 | TOPICAL_CREAM | Freq: Two times a day (BID) | CUTANEOUS | 0 refills | Status: DC

## 2023-02-16 MED ORDER — AMLODIPINE BESYLATE 10 MG PO TABS: 10.0000 mg | ORAL_TABLET | Freq: Every day | ORAL | 2 refills | Status: DC

## 2023-02-16 MED ORDER — CETIRIZINE HCL 10 MG PO TABS: 10.0000 mg | ORAL_TABLET | Freq: Every day | ORAL | 11 refills | Status: AC

## 2023-02-16 MED ORDER — FLUTICASONE PROPIONATE 50 MCG/ACT NA SUSP: 2.0000 | Freq: Every day | NASAL | 6 refills | Status: AC

## 2023-02-16 NOTE — Progress Notes (Signed)
Contacted patient in anticipation of upcoming Levemir discontinuation. MyChart message sent.   Catie Hedwig Morton, PharmD, [place], [facility] [phone]

## 2023-02-16 NOTE — Patient Instructions (Signed)
1. Type 2 diabetes mellitus without complication, without long-term current use of insulin (Derrick Black)  - Ambulatory referral to Ophthalmology - POCT glycosylated hemoglobin (Hb A1C) - metFORMIN (GLUCOPHAGE-XR) 500 MG 24 hr tablet; Take 2 tablets (1,000 mg total) by mouth daily with breakfast.  Dispense: 60 tablet; Refill: 2 - triamcinolone cream (KENALOG) 0.1 %; Apply 1 Application topically 2 (two) times daily.  Dispense: 30 g; Refill: 0  2. Essential hypertension  - amLODipine (NORVASC) 10 MG tablet; Take 1 tablet (10 mg total) by mouth daily.  Dispense: 30 tablet; Refill: 2    Follow up:  Follow up in 3 months or sooner if needed

## 2023-02-16 NOTE — Progress Notes (Signed)
@Patient  ID: Derrick Black, male    DOB: 05/28/1976, 47 y.o.   MRN: [id number]  Chief Complaint  Patient presents with   Sore Throat    Sore throat started two day ago   Cough    Referring provider: Fenton Foy, NP   HPI  47 year old male with history of diabetes and hypertension.   Patient presents today for a follow-up on hypertension and diabetes.  He states that his blood sugars have been ranging between 147-150's at home.  He is compliant with medications.  He does need refills today.  He does complain of cough and sore throat for the past 2 days.  He denies any fevers.  We will check him for flu and COVID. Denies f/c/s, n/v/d, hemoptysis, PND, leg swelling Denies chest pain or edema     Allergies  Allergen Reactions   Penicillins      There is no immunization history on file for this patient.  History reviewed. No pertinent past medical history.  Tobacco History: Social History   Tobacco Use  Smoking Status Former   Types: Cigarettes  Smokeless Tobacco Never   Counseling given: Not Answered   Outpatient Encounter Medications as of 02/16/2023  Medication Sig   cetirizine (ZYRTEC) 10 MG tablet Take 1 tablet (10 mg total) by mouth daily.   fluticasone (FLONASE) 50 MCG/ACT nasal spray Place 2 sprays into both nostrils daily.   glucose blood (ONETOUCH VERIO) test strip Use as instructed to check blood sugar up to three times daily.   metFORMIN (GLUCOPHAGE-XR) 500 MG 24 hr tablet Take 2 tablets (1,000 mg total) by mouth daily with breakfast.   triamcinolone cream (KENALOG) 0.1 % Apply 1 Application topically 2 (two) times daily.   [DISCONTINUED] amLODipine (NORVASC) 10 MG tablet Take 1 tablet (10 mg total) by mouth daily.   [DISCONTINUED] metFORMIN (GLUCOPHAGE) 500 MG tablet Take 1 tablet (500 mg total) by mouth 2 (two) times daily with a meal.   amLODipine (NORVASC) 10 MG tablet Take 1 tablet (10 mg total) by mouth daily.   diclofenac (VOLTAREN) 50  MG EC tablet Take one tab po bid x 2 weeks and then bid prn pain (Patient not taking: Reported on 02/15/2022)   insulin aspart (NOVOLOG FLEXPEN) 100 UNIT/ML FlexPen Inject 5 Units into the skin in the morning, at noon, and at bedtime. (Patient not taking: Reported on 02/16/2023)   insulin detemir (LEVEMIR FLEXPEN) 100 UNIT/ML FlexPen Inject 35 Units into the skin daily. (Patient not taking: Reported on 02/16/2023)   Insulin Pen Needle 32G X 4 MM MISC See admin instructions. (Patient not taking: Reported on 02/16/2023)   traMADol (ULTRAM) 50 MG tablet Take one tab po tid prn pain (Patient not taking: Reported on 02/15/2022)   No facility-administered encounter medications on file as of 02/16/2023.     Review of Systems  Review of Systems  Constitutional: Negative.   HENT:  Positive for sore throat.   Respiratory:  Positive for cough.   Cardiovascular: Negative.   Gastrointestinal: Negative.   Allergic/Immunologic: Negative.   Neurological: Negative.   Psychiatric/Behavioral: Negative.         Physical Exam  BP (!) 145/75   Pulse (!) 101   Temp 97.7 F (36.5 C)   Ht 6' (1.829 m)   Wt 297 lb 6.4 oz (134.9 kg)   SpO2 96%   BMI 40.33 kg/m   Wt Readings from Last 5 Encounters:  02/16/23 297 lb 6.4 oz (134.9 kg)  08/25/22  277 lb 6 oz (125.8 kg)  05/19/22 (!) 314 lb 6.4 oz (142.6 kg)  03/01/22 (!) 313 lb 8 oz (142.2 kg)  02/15/22 (!) 313 lb 12.8 oz (142.3 kg)     Physical Exam Vitals and nursing note reviewed.  Constitutional:      General: He is not in acute distress.    Appearance: He is well-developed.  Cardiovascular:     Rate and Rhythm: Normal rate and regular rhythm.  Pulmonary:     Effort: Pulmonary effort is normal.     Breath sounds: Normal breath sounds.  Skin:    General: Skin is warm and dry.  Neurological:     Mental Status: He is alert and oriented to person, place, and time.      Lab Results:  CBC    Component Value Date/Time   WBC 6.0 08/25/2022  1227   RBC 4.50 08/25/2022 1227   HGB 15.0 08/25/2022 1227   HCT 42.5 08/25/2022 1227   PLT 223 08/25/2022 1227   MCV 94 08/25/2022 1227   MCH 33.3 (H) 08/25/2022 1227   MCHC 35.3 08/25/2022 1227   RDW 13.5 08/25/2022 1227    BMET    Component Value Date/Time   NA 135 08/25/2022 1227   K 4.3 08/25/2022 1227   CL CANCELED 08/25/2022 1227   CO2 19 (L) 08/25/2022 1227   GLUCOSE 372 (H) 08/25/2022 1227   BUN 13 08/25/2022 1227   CREATININE 1.09 08/25/2022 1227   CALCIUM 10.2 08/25/2022 1227    BNP No results found for: "BNP"  ProBNP No results found for: "PROBNP"  Imaging: No results found.   Assessment & Plan:   Type 2 diabetes mellitus without complication, without long-term current use of insulin ([place]) - Ambulatory referral to Ophthalmology - POCT glycosylated hemoglobin (Hb A1C) - metFORMIN (GLUCOPHAGE-XR) 500 MG 24 hr tablet; Take 2 tablets (1,000 mg total) by mouth daily with breakfast.  Dispense: 60 tablet; Refill: 2 - triamcinolone cream (KENALOG) 0.1 %; Apply 1 Application topically 2 (two) times daily.  Dispense: 30 g; Refill: 0  2. Essential hypertension  - amLODipine (NORVASC) 10 MG tablet; Take 1 tablet (10 mg total) by mouth daily.  Dispense: 30 tablet; Refill: 2    Follow up:  Follow up in 3 months or sooner if needed     Fenton Foy, NP 02/16/2023

## 2023-02-16 NOTE — Assessment & Plan Note (Signed)
-   Ambulatory referral to Ophthalmology - POCT glycosylated hemoglobin (Hb A1C) - metFORMIN (GLUCOPHAGE-XR) 500 MG 24 hr tablet; Take 2 tablets (1,000 mg total) by mouth daily with breakfast.  Dispense: 60 tablet; Refill: 2 - triamcinolone cream (KENALOG) 0.1 %; Apply 1 Application topically 2 (two) times daily.  Dispense: 30 g; Refill: 0  2. Essential hypertension  - amLODipine (NORVASC) 10 MG tablet; Take 1 tablet (10 mg total) by mouth daily.  Dispense: 30 tablet; Refill: 2    Follow up:  Follow up in 3 months or sooner if needed

## 2023-02-17 LAB — CBC
Hematocrit: 43.4 % (ref 37.5–51.0)
Hemoglobin: 14.7 g/dL (ref 13.0–17.7)
MCH: 32.2 pg (ref 26.6–33.0)
MCHC: 33.9 g/dL (ref 31.5–35.7)
MCV: 95 fL (ref 79–97)
Platelets: 157 10*3/uL (ref 150–450)
RBC: 4.56 x10E6/uL (ref 4.14–5.80)
RDW: 13.9 % (ref 11.6–15.4)
WBC: 7.1 10*3/uL (ref 3.4–10.8)

## 2023-02-17 LAB — COMPREHENSIVE METABOLIC PANEL
ALT: 47 IU/L — ABNORMAL HIGH (ref 0–44)
AST: 53 IU/L — ABNORMAL HIGH (ref 0–40)
Albumin/Globulin Ratio: 1.4 (ref 1.2–2.2)
Albumin: 4.6 g/dL (ref 4.1–5.1)
Alkaline Phosphatase: 98 IU/L (ref 44–121)
BUN/Creatinine Ratio: 7 — ABNORMAL LOW (ref 9–20)
BUN: 8 mg/dL (ref 6–24)
Bilirubin Total: 0.5 mg/dL (ref 0.0–1.2)
CO2: 16 mmol/L — ABNORMAL LOW (ref 20–29)
Calcium: 9.8 mg/dL (ref 8.7–10.2)
Chloride: 100 mmol/L (ref 96–106)
Creatinine, Ser: 1.14 mg/dL (ref 0.76–1.27)
Globulin, Total: 3.2 g/dL (ref 1.5–4.5)
Glucose: 181 mg/dL — ABNORMAL HIGH (ref 70–99)
Potassium: 4.9 mmol/L (ref 3.5–5.2)
Sodium: 139 mmol/L (ref 134–144)
Total Protein: 7.8 g/dL (ref 6.0–8.5)
eGFR: 81 mL/min/{1.73_m2} (ref >59)

## 2023-02-19 NOTE — Progress Notes (Signed)
02/19/2023 Name: Artavius Ganter MRN: [id number] DOB: 03-28-1976  Chief Complaint  Patient presents with   Medication Management   Diabetes    Juwan Eliazar Dankenbring is a 47 y.o. year old male who presented for a telephone visit.   They were referred to the pharmacist by their PCP for assistance in managing diabetes.    Subjective:  Care Team: Primary Care Provider: Fenton Foy, NP ; Next Scheduled Visit: 05/18/23  Medication Access/Adherence  Current Pharmacy:  [facility] [place], [address] [place] [address] Phone: [phone] Fax: [phone]   Patient reports affordability concerns with their medications: No  Patient reports access/transportation concerns to their pharmacy: No  Patient reports adherence concerns with their medications:  No     Diabetes:  Current medications: metformin XR 1000 mg daily; previously on Levemir and Novolog but has not taken for several months  Hypertension:  Current medications: amlodipine 10 mg daily  Reports he had run out of amlodipine prior to his in office visit last week   Hyperlipidemia/ASCVD Risk Reduction  Current lipid lowering medications: none  PREVENT Risk Score: 10 year risk of CVD: 9.2% - 10 year risk of ASCVD: 7.5% - 10 year risk of HF: 4.4%   Objective:  Lab Results  Component Value Date   HGBA1C 7.5 (A) 02/16/2023    Lab Results  Component Value Date   CREATININE 1.14 02/16/2023   BUN 8 02/16/2023   NA 139 02/16/2023   K 4.9 02/16/2023   CL 100 02/16/2023   CO2 16 (L) 02/16/2023    Lab Results  Component Value Date   CHOL 272 (H) 08/25/2022   HDL 21 (L) 08/25/2022   LDLCALC Comment (A) 08/25/2022   TRIG 2,037 (HH) 08/25/2022   CHOLHDL 13.0 (H) 08/25/2022    Medications Reviewed Today     Reviewed by Fenton Foy, NP (Nurse Practitioner) on 02/16/23 at 1425  Med List Status: <None>   Medication Order  Taking? Sig Documenting Provider Last Dose Status Informant  amLODipine (NORVASC) 10 MG tablet [id number]  Take 1 tablet (10 mg total) by mouth daily. Fenton Foy, NP  Active   cetirizine (ZYRTEC) 10 MG tablet [id number] Yes Take 1 tablet (10 mg total) by mouth daily. Fenton Foy, NP  Active   diclofenac (VOLTAREN) 50 MG EC tablet [id number] No Take one tab po bid x 2 weeks and then bid prn pain  Patient not taking: Reported on 02/15/2022   Aundra Dubin, PA-C Not Taking Active   fluticasone [facility]) 50 MCG/ACT nasal spray [id number] Yes Place 2 sprays into both nostrils daily. Fenton Foy, NP  Active   glucose blood [facility] VERIO) test strip [id number] Yes Use as instructed to check blood sugar up to three times daily. Fenton Foy, NP Taking Active   insulin aspart (NOVOLOG FLEXPEN) 100 UNIT/ML FlexPen [id number] No Inject 5 Units into the skin in the morning, at noon, and at bedtime.  Patient not taking: Reported on 02/16/2023   Fenton Foy, NP Not Taking Active   insulin detemir (LEVEMIR FLEXPEN) 100 UNIT/ML FlexPen [id number] No Inject 35 Units into the skin daily.  Patient not taking: Reported on 02/16/2023   Fenton Foy, NP Not Taking Active   Insulin Pen Needle 32G X 4 MM MISC [id number] No See admin instructions.  Patient not taking: Reported on 02/16/2023   [provider] Not Taking Active  metFORMIN (GLUCOPHAGE-XR) 500 MG 24 hr tablet [id number] Yes Take 2 tablets (1,000 mg total) by mouth daily with breakfast. Fenton Foy, NP  Active   traMADol (ULTRAM) 50 MG tablet [id number] No Take one tab po tid prn pain  Patient not taking: Reported on 02/15/2022   Aundra Dubin, PA-C Not Taking Active   triamcinolone cream (KENALOG) 0.1 % [id number] Yes Apply 1 Application topically 2 (two) times daily. Fenton Foy, NP  Active               Assessment/Plan:   Diabetes: - Currently uncontrolled - Reviewed long term cardiovascular and renal  outcomes of uncontrolled blood sugar - Reviewed goal A1c, goal fasting, and goal 2 hour post prandial glucose - Given upcoming Levemir discontinuation, change in therapy is warranted. As he has been off of insulin prior to this A1c of 7.5%, insulin therapy is likely not required. Discussed GLP1 vs SGLT2, including mechanism of action, side effects. No history of pancreatitis, though last triglycerides are elevated. Patient elects to pursue coverage of GLP1. PA submitted today through Cover My Meds.  - Recommend to continue metformin at current dose.   Hypertension: - Currently uncontrolled, though patient had run out of medication prior to visit.  - Recommend to continue current regimen, will discuss home monitoring moving forward.   Hyperlipidemia/ASCVD Risk Reduction: - Currently uncontrolled.  - Recommend to recheck lipids at next visit. Consider benefit vs risk of moderate intensity statin therapy.   Follow Up Plan: pending GLP1 access  Catie Hedwig Morton, PharmD, [place], [facility] [phone]

## 2023-02-23 ENCOUNTER — Other Ambulatory Visit: Payer: Self-pay

## 2023-02-23 DIAGNOSIS — E119 Type 2 diabetes mellitus without complications: Secondary | ICD-10-CM

## 2023-02-23 NOTE — Telephone Encounter (Signed)
Please advise KH 

## 2023-02-23 NOTE — Telephone Encounter (Signed)
From: Charlyne Petrin To: Office of Fenton Foy, NP Sent: 02/23/2023 2:05 PM EDT Subject: Medication Renewal Request  Refills have been requested for the following medications:   triamcinolone cream (KENALOG) 0.1 % Kenney Houseman S [place]]  Preferred pharmacy: [place], [place] [id number] Delivery method: [employer]

## 2023-02-26 MED ORDER — TRIAMCINOLONE ACETONIDE 0.1 % EX CREA: 1.0000 | TOPICAL_CREAM | Freq: Two times a day (BID) | CUTANEOUS | 0 refills | Status: DC

## 2023-02-27 NOTE — Patient Instructions (Signed)
Derrick Black,   We will submit a Prior Authorization for Ozempic on your insurance. I will let you know when I hear something.   Thanks!  Catie Hedwig Morton, PharmD, [place], [facility] [phone]

## 2023-03-01 ENCOUNTER — Other Ambulatory Visit: Payer: Self-pay | Admitting: Pharmacist

## 2023-03-01 NOTE — Progress Notes (Signed)
Care Coordination Call  Contacted insurance to follow up on PA; PA was approved. Script for [employer] [facility] per prior approval from PCP. Per [facility], copay is $24.   Contacted patient to notify, reviewed dosing, side effects. He verbalizes understanding.   Catie Eppie Gibson. Dafney Farler, PharmD, BCACP, CPP [facility] [phone]

## 2023-03-02 MED ORDER — OZEMPIC (0.25 OR 0.5 MG/DOSE) 2 MG/3ML ~~LOC~~ SOPN: PEN_INJECTOR | SUBCUTANEOUS | 2 refills | Status: DC

## 2023-03-02 NOTE — Patient Instructions (Signed)
[place].   Start Ozempic 0.25 mg weekly for 4 weeks, then increase to 0.5 mg weekly.   This medication may cause stomach upset, queasiness, or constipation, especially when first starting. This generally improves over time. Call our office if these symptoms occur and worsen, or if you have severe symptoms such as vomiting, diarrhea, or stomach pain.    Check your blood sugars twice daily:  1) Fasting, first thing in the morning before breakfast and  2) 2 hours after your largest meal.   For a goal A1c of less than 7%, goal fasting readings are less than 130 and goal 2 hour after meal readings are less than 180.    I'll call you in a few weeks to check in. Take care!  Catie Eppie Gibson, PharmD, BCACP, CPP [facility] [phone]

## 2023-03-12 ENCOUNTER — Telehealth: Payer: Self-pay | Admitting: Pharmacist

## 2023-03-12 NOTE — Progress Notes (Signed)
MyChart message sent to reschedule upcoming appointment.   Catie Eppie Gibson, PharmD, BCACP, CPP [facility] [phone]

## 2023-03-29 ENCOUNTER — Other Ambulatory Visit: Payer: BC Managed Care – PPO | Admitting: Pharmacist

## 2023-03-29 NOTE — Progress Notes (Signed)
03/29/2023 Name: Derrick Black MRN: [id number] DOB: 09-12-1976  Chief Complaint  Patient presents with   Medication Management   Diabetes   Hypertension   Hyperlipidemia    Derrick Black is a 47 y.o. year old male who presented for a telephone visit.   They were referred to the pharmacist by their PCP for assistance in managing diabetes and hypertension.   Subjective:  Care Team: Primary Care Provider: Ivonne Andrew, NP ; Next Scheduled Visit: 6/24  Medication Access/Adherence  Current Pharmacy:  [facility] [address] [place], [place] - [address] [place] [address] Phone: [phone] Fax: [phone]   Patient reports affordability concerns with their medications: No  Patient reports access/transportation concerns to their pharmacy: No  Patient reports adherence concerns with their medications:  No    Patient asks about supplements and how they compare to medications he is currently on.   Diabetes:  Current medications: Ozempic 0.25 mg weekly for 2 weeks, metformin XR 1000 mg daily  Current glucose readings: does recall one reading of ~145 when he was at work. He felt dizzy so he checked his blood sugars.   Denies stomach upset, nausea. Denies any significant impact on appetite yet.   Hypertension:  Current medications: amlodipine 10 mg daily  Patient has a validated, automated, upper arm home BP cuff, but does not recall any recent readings  Patient reports hypotensive s/sx including one episode of dizziness, glucose was normal at the time.    Hyperlipidemia/ASCVD Risk Reduction   Current lipid lowering medications: none   PREVENT Risk Score: 10 year risk of CVD: 9.2% - 10 year risk of ASCVD: 7.5% - 10 year risk of HF: 4.4%     Objective:  Lab Results  Component Value Date   HGBA1C 7.5 (A) 02/16/2023    Lab Results  Component Value Date   CREATININE 1.14 02/16/2023   BUN 8  02/16/2023   NA 139 02/16/2023   K 4.9 02/16/2023   CL 100 02/16/2023   CO2 16 (L) 02/16/2023    Lab Results  Component Value Date   CHOL 272 (H) 08/25/2022   HDL 21 (L) 08/25/2022   LDLCALC Comment (A) 08/25/2022   TRIG 2,037 (HH) 08/25/2022   CHOLHDL 13.0 (H) 08/25/2022    Medications Reviewed Today     Reviewed by Alden Hipp, RPH-CPP (Pharmacist) on 03/29/23 at 1431  Med List Status: <None>   Medication Order Taking? Sig Documenting Provider Last Dose Status Informant  amLODipine (NORVASC) 10 MG tablet [id number] Yes Take 1 tablet (10 mg total) by mouth daily. Ivonne Andrew, NP Taking Active   cetirizine (ZYRTEC) 10 MG tablet [id number] Yes Take 1 tablet (10 mg total) by mouth daily. Ivonne Andrew, NP Taking Active   diclofenac (VOLTAREN) 50 MG EC tablet [id number]  Take one tab po bid x 2 weeks and then bid prn pain  Patient not taking: Reported on 02/15/2022   Cristie Hem, PA-C  Active   fluticasone [facility]) 50 MCG/ACT nasal spray [id number]  Place 2 sprays into both nostrils daily.  Patient not taking: Reported on 02/27/2023   Ivonne Andrew, NP  Active   glucose blood [facility] VERIO) test strip [id number] Yes Use as instructed to check blood sugar up to three times daily. Ivonne Andrew, NP Taking Active   Insulin Pen Needle 32G X 4 MM MISC [id number]  See admin instructions.  Patient not taking: Reported  on 02/16/2023   [provider]  Active   metFORMIN (GLUCOPHAGE-XR) 500 MG 24 hr tablet [id number] Yes Take 2 tablets (1,000 mg total) by mouth daily with breakfast. Ivonne Andrew, NP Taking Active   Semaglutide,0.25 or 0.5MG /DOS, (OZEMPIC, 0.25 OR 0.5 MG/DOSE,) 2 MG/3ML SOPN [id number] Yes Inject 0.25 mg weekly for 4 weeks, then increase to 0.5 mg weekly Ivonne Andrew, NP Taking Active   traMADol (ULTRAM) 50 MG tablet [id number]  Take one tab po tid prn pain  Patient not taking: Reported on 02/15/2022   Cristie Hem, PA-C  Active    triamcinolone cream (KENALOG) 0.1 % [id number]  Apply 1 Application topically 2 (two) times daily. Ivonne Andrew, NP  Active               Assessment/Plan:   Diabetes: - Currently uncontrolled - Discussed that 2 weeks of lowest dose may not provide significant impact. Increase to 0.5 mg weekly on week 5 as prescribed. - Recommend to check glucose twice daily, fasting and 2 hour post prandial  Hypertension: - Currently uncontrolled - Reviewed long term cardiovascular and renal outcomes of uncontrolled blood pressure - Reviewed appropriate blood pressure monitoring technique and reviewed goal blood pressure. Recommended to check home blood pressure and heart rate periodically, document, and provide to me - Recommend to continue current regimen   Hyperlipidemia/ASCVD Risk Reduction: - Currently uncontrolled.  - Recommend to recheck lipids at next visit. Consider benefit vs risk of moderate intensity statin therapy.      Follow Up Plan: phone call in 4 weeks  Catie TClearance Coots, PharmD, BCACP, CPP [facility] [phone]

## 2023-03-29 NOTE — Patient Instructions (Signed)
Derrick Black,   It was great talking to you today!  Continue Ozempic 0.25 for a total of 4 weeks. On week 5, increase to 0.5 mg weekly. Continue metformin XR 2 tablets daily.   Check your blood sugars periodically, alternating between:  1) Fasting, first thing in the morning before breakfast and  2) 2 hours after your largest meal.   For a goal A1c of less than 7%, goal fasting readings are less than 130 and goal 2 hour after meal readings are less than 180.

## 2023-03-30 ENCOUNTER — Other Ambulatory Visit: Payer: BC Managed Care – PPO | Admitting: Pharmacist

## 2023-04-27 ENCOUNTER — Other Ambulatory Visit: Payer: Self-pay

## 2023-04-27 DIAGNOSIS — E119 Type 2 diabetes mellitus without complications: Secondary | ICD-10-CM

## 2023-04-27 MED ORDER — TRIAMCINOLONE ACETONIDE 0.1 % EX CREA
1.0000 | TOPICAL_CREAM | Freq: Two times a day (BID) | CUTANEOUS | 0 refills | Status: AC
Start: 2023-04-27 — End: ?

## 2023-04-27 NOTE — Telephone Encounter (Signed)
From: Marella Chimes To: Office of Ivonne Andrew, NP Sent: 04/27/2023 9:10 AM EDT Subject: Medication Renewal Request  Refills have been requested for the following medications:   triamcinolone cream (KENALOG) 0.1 % Archie Patten S [place]]  Preferred pharmacy: [id number] - [place], [place] - [address] Delivery method: [employer]

## 2023-04-27 NOTE — Telephone Encounter (Signed)
Please advise Kh 

## 2023-05-01 ENCOUNTER — Other Ambulatory Visit: Payer: BC Managed Care – PPO | Admitting: Pharmacist

## 2023-05-01 NOTE — Progress Notes (Signed)
05/01/2023 Name: Derrick Black MRN: [id number] DOB: 02/18/1976  Chief Complaint  Patient presents with   Medication Management   Diabetes    Derrick Black is a 47 y.o. year old male who presented for a telephone visit.   They were referred to the pharmacist by their PCP for assistance in managing diabetes.    Subjective:  Care Team: Primary Care Provider: Ivonne Andrew, NP ; Next Scheduled Visit:   Medication Access/Adherence  Current Pharmacy:  [facility] [address] [place], [place] - [address] [place] [address] Phone: [phone] Fax: [phone]   Patient reports affordability concerns with their medications: No  Patient reports access/transportation concerns to their pharmacy: No  Patient reports adherence concerns with their medications:  No    Diabetes:  Current medications: Ozempic 0.5 mg weekly, metformin XR 1000 mg twice daily  Current glucose readings: reading last week was "good", but can't remember exact reading. Believes it was less than 150.   Does note that it is weird not being hungry. Historically has not eaten breakfast, but is also not hungry by lunch time. Reports he has lost ~ 5 lbs.   Current meal patterns:  - Breakfast: generally skips - Lunch: generally skips  - Supper: protein, vegetables.  - Drinks: water  Hypertension:  Current medications: amlodipine 10 mg   Patient has a validated, automated, upper arm home BP cuff Current blood pressure readings readings: has not been checking  Hyperlipidemia/ASCVD Risk Reduction   Current lipid lowering medications: none   PREVENT Risk Score: 10 year risk of CVD: 9.2% - 10 year risk of ASCVD: 7.5% - 10 year risk of HF: 4.4%  Objective:  Lab Results  Component Value Date   HGBA1C 7.5 (A) 02/16/2023    Lab Results  Component Value Date   CREATININE 1.14 02/16/2023   BUN 8 02/16/2023   NA 139 02/16/2023   K 4.9  02/16/2023   CL 100 02/16/2023   CO2 16 (L) 02/16/2023    Lab Results  Component Value Date   CHOL 272 (H) 08/25/2022   HDL 21 (L) 08/25/2022   LDLCALC Comment (A) 08/25/2022   TRIG 2,037 (HH) 08/25/2022   CHOLHDL 13.0 (H) 08/25/2022    Medications Reviewed Today     Reviewed by Alden Hipp, RPH-CPP (Pharmacist) on 05/01/23 at 1437  Med List Status: <None>   Medication Order Taking? Sig Documenting Provider Last Dose Status Informant  amLODipine (NORVASC) 10 MG tablet [id number] Yes Take 1 tablet (10 mg total) by mouth daily. Ivonne Andrew, NP Taking Active   cetirizine (ZYRTEC) 10 MG tablet [id number] Yes Take 1 tablet (10 mg total) by mouth daily. Ivonne Andrew, NP Taking Active   diclofenac (VOLTAREN) 50 MG EC tablet [id number]  Take one tab po bid x 2 weeks and then bid prn pain  Patient not taking: Reported on 02/15/2022   Cristie Hem, PA-C  Active   fluticasone [facility]) 50 MCG/ACT nasal spray [id number]  Place 2 sprays into both nostrils daily.  Patient not taking: Reported on 02/27/2023   Ivonne Andrew, NP  Active   glucose blood [facility] VERIO) test strip [id number]  Use as instructed to check blood sugar up to three times daily. Ivonne Andrew, NP  Active   Insulin Pen Needle 32G X 4 MM MISC [id number]  See admin instructions.  Patient not taking: Reported on 02/16/2023   [provider]  Active   metFORMIN (GLUCOPHAGE-XR) 500 MG 24 hr tablet [id number] Yes Take 2 tablets (1,000 mg total) by mouth daily with breakfast. Ivonne Andrew, NP Taking Active   Semaglutide,0.25 or 0.5MG /DOS, (OZEMPIC, 0.25 OR 0.5 MG/DOSE,) 2 MG/3ML SOPN [id number] Yes Inject 0.25 mg weekly for 4 weeks, then increase to 0.5 mg weekly Ivonne Andrew, NP Taking Active   traMADol (ULTRAM) 50 MG tablet [id number]  Take one tab po tid prn pain  Patient not taking: Reported on 02/15/2022   Cristie Hem, PA-C  Active   triamcinolone cream (KENALOG) 0.1 % [id number]  Apply 1  Application topically 2 (two) times daily. Ivonne Andrew, NP  Active               Assessment/Plan:   Diabetes: - Currently uncontrolled but likely improved  - Reviewed long term cardiovascular and renal outcomes of uncontrolled blood sugar - Reviewed goal A1c, goal fasting, and goal 2 hour post prandial glucose - Reviewed dietary modifications including: importance of proteins, fruits and vegetables, whole grains. Encouraged to schedule a source of protein for lunch, even if he isn't hungry.  - Recommend to continue current regimen. Follow up with PCP as scheduled.   Hypertension: - Currently uncontrolled per last documented reading - Recommend to continue current regimen.   Hyperlipidemia/ASCVD Risk Reduction: - Currently untreated.  - Recommend to check lipids, likely start moderate intensity statin.   Follow Up Plan: follow up with PCP  Catie Eppie Gibson, PharmD, BCACP, CPP [facility] [phone]

## 2023-05-01 NOTE — Patient Instructions (Signed)
Ojani,   It was great talking to you today!  Continue metformin 1000 mg twice daily and Ozempic 0.5 mg weekly.  Check your blood sugars twice daily:  1) Fasting, first thing in the morning before breakfast and  2) 2 hours after your largest meal.   For a goal A1c of less than 7%, goal fasting readings are less than 130 and goal 2 hour after meal readings are less than 180.    Think about some quick sources of protein, like greek yogurt, protein shakes, cheese sticks, eggs, peanut butter, or other things that you can eat for lunch even if you aren't hungry.   Continue amlodipine 10 mg daily.   Check your blood pressure periodically, and any time you have concerning symptoms like headache, chest pain, dizziness, shortness of breath, or vision changes.   Our goal is less than 130/80.  To appropriately check your blood pressure, make sure you do the following:  1) Avoid caffeine, exercise, or tobacco products for 30 minutes before checking. Empty your bladder. 2) Sit with your back supported in a flat-backed chair. Rest your arm on something flat (arm of the chair, table, etc). 3) Sit still with your feet flat on the floor, resting, for at least 5 minutes.  4) Check your blood pressure. Take 1-2 readings.  5) Write down these readings and bring with you to any provider appointments.  Bring your home blood pressure machine with you to a provider's office for accuracy comparison at least once a year.   Make sure you take your blood pressure medications before you come to any office visit, even if you were asked to fast for labs.   Take care!  Catie Eppie Gibson, PharmD, BCACP, CPP [facility] [phone]

## 2023-05-15 ENCOUNTER — Other Ambulatory Visit: Payer: Self-pay | Admitting: Nurse Practitioner

## 2023-05-15 DIAGNOSIS — E119 Type 2 diabetes mellitus without complications: Secondary | ICD-10-CM

## 2023-05-15 DIAGNOSIS — I10 Essential (primary) hypertension: Secondary | ICD-10-CM

## 2023-05-17 ENCOUNTER — Other Ambulatory Visit: Payer: Self-pay

## 2023-05-17 NOTE — Progress Notes (Signed)
   Derrick Black [facility] March 03, 1976 [id number]  Patient outreached by Frederic Jericho , PharmD Candidate on 05/17/2023.  Blood Pressure Readings: Does the patient have a validated home blood pressure machine?: Yes  Pt reported not taking BP at home in awhile.  Medication review was performed. Is the patient taking their medications as prescribed?: No   The following barriers to adherence were noted: Does the patient have cost concerns?: No Does the patient have transportation concerns?: No Does the patient need assistance obtaining refills?: Yes Does the patient occassionally forget to take some of their prescribed medications?: Yes Does the patient feel like one/some of their medications make them feel poorly?: No Does the patient have questions or concerns about their medications?: No Does the patient have a follow up scheduled with their primary care provider/cardiologist?: Yes Pt did express that he may need a refill on the his Ozempic 0.5 mL/weekly. When asked if he misses any medications, he said he misses them very rarely.  Interventions: Interventions Completed: Medications were reviewed, Patient was educated on goal blood pressures and long term health implications of elevated blood pressure, Patient was educated on medications, including indication and administration  The patient has follow up scheduled:  PCP: Derrick Andrew, NP   Frederic Jericho, Student-PharmD

## 2023-05-18 ENCOUNTER — Ambulatory Visit: Payer: Self-pay | Admitting: Nurse Practitioner

## 2023-06-19 ENCOUNTER — Other Ambulatory Visit: Payer: Self-pay | Admitting: Nurse Practitioner

## 2023-06-20 ENCOUNTER — Other Ambulatory Visit: Payer: Self-pay

## 2023-06-20 MED ORDER — OZEMPIC (0.25 OR 0.5 MG/DOSE) 2 MG/3ML ~~LOC~~ SOPN: PEN_INJECTOR | SUBCUTANEOUS | 0 refills | Status: DC

## 2023-07-16 ENCOUNTER — Other Ambulatory Visit: Payer: Self-pay | Admitting: Nurse Practitioner

## 2023-07-16 MED ORDER — OZEMPIC (0.25 OR 0.5 MG/DOSE) 2 MG/3ML ~~LOC~~ SOPN: PEN_INJECTOR | SUBCUTANEOUS | 0 refills | Status: DC

## 2023-07-23 ENCOUNTER — Other Ambulatory Visit: Payer: Self-pay | Admitting: Nurse Practitioner

## 2023-07-23 MED ORDER — OZEMPIC (0.25 OR 0.5 MG/DOSE) 2 MG/3ML ~~LOC~~ SOPN: PEN_INJECTOR | SUBCUTANEOUS | 0 refills | Status: DC

## 2023-07-25 ENCOUNTER — Encounter: Payer: Self-pay | Admitting: Nurse Practitioner

## 2023-07-25 ENCOUNTER — Ambulatory Visit (INDEPENDENT_AMBULATORY_CARE_PROVIDER_SITE_OTHER): Payer: Self-pay | Admitting: Nurse Practitioner

## 2023-07-25 VITALS — BP 142/90 | HR 96 | Temp 97.9°F | Ht 72.0 in | Wt 291.0 lb

## 2023-07-25 DIAGNOSIS — E119 Type 2 diabetes mellitus without complications: Secondary | ICD-10-CM

## 2023-07-25 DIAGNOSIS — I10 Essential (primary) hypertension: Secondary | ICD-10-CM

## 2023-07-25 DIAGNOSIS — Z13220 Encounter for screening for lipoid disorders: Secondary | ICD-10-CM

## 2023-07-25 LAB — POCT GLYCOSYLATED HEMOGLOBIN (HGB A1C): HbA1c, POC (controlled diabetic range): 6.7 % (ref 0.0–7.0)

## 2023-07-25 MED ORDER — METFORMIN HCL ER 500 MG PO TB24
500.0000 mg | ORAL_TABLET | Freq: Every day | ORAL | 0 refills | Status: AC
Start: 2023-07-25 — End: ?

## 2023-07-25 MED ORDER — OZEMPIC (0.25 OR 0.5 MG/DOSE) 2 MG/3ML ~~LOC~~ SOPN: PEN_INJECTOR | SUBCUTANEOUS | 0 refills | Status: AC

## 2023-07-25 MED ORDER — AMLODIPINE BESYLATE 10 MG PO TABS
10.0000 mg | ORAL_TABLET | Freq: Every day | ORAL | 0 refills | Status: AC
Start: 2023-07-25 — End: ?

## 2023-07-25 NOTE — Assessment & Plan Note (Signed)
-   POCT glycosylated hemoglobin (Hb A1C) - Lipid Panel - CBC - Comprehensive metabolic panel  2. Lipid screening  - Lipid Panel  Follow up:  Follow up in 3 months

## 2023-07-25 NOTE — Patient Instructions (Addendum)
1. Type 2 diabetes mellitus without complication, without long-term current use of insulin (HCC)  - POCT glycosylated hemoglobin (Hb A1C) - Lipid Panel - CBC - Comprehensive metabolic panel  2. Lipid screening  - Lipid Panel  Follow up:  Follow up in 3 months

## 2023-07-25 NOTE — Progress Notes (Signed)
@Patient  ID: Derrick Black, male    DOB: 1975-12-08, 47 y.o.   MRN: [id number]  Chief Complaint  Patient presents with   Diabetes    Referring provider: Ivonne Andrew, NP   HPI  47 year old male with history of diabetes and hypertension.    Patient presents today for a follow-up on hypertension and diabetes.  Has been lost to follow up for 5 months. He is compliant with medications.  He does need refills today.  He admits that he has been stress eating because his grandmother passed away.  A1c in office today was improved though at 6.7.  Patient does need cholesterol rechecked today.  Denies f/c/s, n/v/d, hemoptysis, PND, leg swelling. Denies chest pain or edema.      Allergies  Allergen Reactions   Penicillins      There is no immunization history on file for this patient.  History reviewed. No pertinent past medical history.  Tobacco History: Social History   Tobacco Use  Smoking Status Former   Types: Cigarettes  Smokeless Tobacco Never   Counseling given: Not Answered   Outpatient Encounter Medications as of 07/25/2023  Medication Sig   [DISCONTINUED] amLODipine (NORVASC) 10 MG tablet Take 1 tablet by mouth once daily   [DISCONTINUED] metFORMIN (GLUCOPHAGE-XR) 500 MG 24 hr tablet TAKE 2 TABLETS BY MOUTH ONCE DAILY WITH BREAKFAST   [DISCONTINUED] Semaglutide,0.25 or 0.5MG /DOS, (OZEMPIC, 0.25 OR 0.5 MG/DOSE,) 2 MG/3ML SOPN INJECT 0.25MG  INTO THE SKIN ONCE A WEEK FOR 4 WEEKS THEN INCREASE TO 0.5MG  ONCE A WEEK   amLODipine (NORVASC) 10 MG tablet Take 1 tablet (10 mg total) by mouth daily.   cetirizine (ZYRTEC) 10 MG tablet Take 1 tablet (10 mg total) by mouth daily. (Patient not taking: Reported on 05/17/2023)   diclofenac (VOLTAREN) 50 MG EC tablet Take one tab po bid x 2 weeks and then bid prn pain (Patient not taking: Reported on 02/15/2022)   fluticasone (FLONASE) 50 MCG/ACT nasal spray Place 2 sprays into both nostrils daily. (Patient not taking:  Reported on 02/27/2023)   glucose blood (ONETOUCH VERIO) test strip Use as instructed to check blood sugar up to three times daily. (Patient not taking: Reported on 05/17/2023)   Insulin Pen Needle 32G X 4 MM MISC See admin instructions. (Patient not taking: Reported on 02/16/2023)   metFORMIN (GLUCOPHAGE-XR) 500 MG 24 hr tablet Take 1 tablet (500 mg total) by mouth daily with breakfast.   Semaglutide,0.25 or 0.5MG /DOS, (OZEMPIC, 0.25 OR 0.5 MG/DOSE,) 2 MG/3ML SOPN INJECT 0.25MG  INTO THE SKIN ONCE A WEEK FOR 4 WEEKS THEN INCREASE TO 0.5MG  ONCE A WEEK   traMADol (ULTRAM) 50 MG tablet Take one tab po tid prn pain (Patient not taking: Reported on 02/15/2022)   triamcinolone cream (KENALOG) 0.1 % Apply 1 Application topically 2 (two) times daily. (Patient not taking: Reported on 07/25/2023)   No facility-administered encounter medications on file as of 07/25/2023.     Review of Systems  Review of Systems  Constitutional: Negative.   HENT: Negative.    Cardiovascular: Negative.   Gastrointestinal: Negative.   Allergic/Immunologic: Negative.   Neurological: Negative.   Psychiatric/Behavioral: Negative.         Physical Exam  BP (!) 142/90   Pulse 96   Temp 97.9 F (36.6 C) (Oral)   Ht 6' (1.829 m)   Wt 291 lb (132 kg)   SpO2 100%   BMI 39.47 kg/m   Wt Readings from Last 5 Encounters:  07/25/23 291 lb (  132 kg)  02/16/23 297 lb 6.4 oz (134.9 kg)  08/25/22 277 lb 6 oz (125.8 kg)  05/19/22 (!) 314 lb 6.4 oz (142.6 kg)  03/01/22 (!) 313 lb 8 oz (142.2 kg)     Physical Exam Vitals and nursing note reviewed.  Constitutional:      General: He is not in acute distress.    Appearance: He is well-developed.  Cardiovascular:     Rate and Rhythm: Normal rate and regular rhythm.  Pulmonary:     Effort: Pulmonary effort is normal.     Breath sounds: Normal breath sounds.  Skin:    General: Skin is warm and dry.  Neurological:     Mental Status: He is alert and oriented to person,  place, and time.      Lab Results:  CBC    Component Value Date/Time   WBC 7.1 02/16/2023 1144   RBC 4.56 02/16/2023 1144   HGB 14.7 02/16/2023 1144   HCT 43.4 02/16/2023 1144   PLT 157 02/16/2023 1144   MCV 95 02/16/2023 1144   MCH 32.2 02/16/2023 1144   MCHC 33.9 02/16/2023 1144   RDW 13.9 02/16/2023 1144    BMET    Component Value Date/Time   NA 139 02/16/2023 1144   K 4.9 02/16/2023 1144   CL 100 02/16/2023 1144   CO2 16 (L) 02/16/2023 1144   GLUCOSE 181 (H) 02/16/2023 1144   BUN 8 02/16/2023 1144   CREATININE 1.14 02/16/2023 1144   CALCIUM 9.8 02/16/2023 1144     Assessment & Plan:   Type 2 diabetes mellitus without complication, without long-term current use of insulin (HCC) - POCT glycosylated hemoglobin (Hb A1C) - Lipid Panel - CBC - Comprehensive metabolic panel  2. Lipid screening  - Lipid Panel  Follow up:  Follow up in 3 months     Ivonne Andrew, NP 07/25/2023

## 2023-07-26 ENCOUNTER — Other Ambulatory Visit: Payer: Self-pay | Admitting: Nurse Practitioner

## 2023-07-26 DIAGNOSIS — E781 Pure hyperglyceridemia: Secondary | ICD-10-CM

## 2023-07-26 LAB — CBC
Hematocrit: 43.1 % (ref 37.5–51.0)
Hemoglobin: 14.6 g/dL (ref 13.0–17.7)
MCH: 31.8 pg (ref 26.6–33.0)
MCHC: 33.9 g/dL (ref 31.5–35.7)
MCV: 94 fL (ref 79–97)
Platelets: 217 10*3/uL (ref 150–450)
RBC: 4.59 x10E6/uL (ref 4.14–5.80)
RDW: 14.4 % (ref 11.6–15.4)
WBC: 5.3 10*3/uL (ref 3.4–10.8)

## 2023-07-26 LAB — COMPREHENSIVE METABOLIC PANEL
ALT: 54 IU/L — ABNORMAL HIGH (ref 0–44)
AST: 43 IU/L — ABNORMAL HIGH (ref 0–40)
Albumin: 4.4 g/dL (ref 4.1–5.1)
Alkaline Phosphatase: 107 IU/L (ref 44–121)
BUN/Creatinine Ratio: 10 (ref 9–20)
BUN: 11 mg/dL (ref 6–24)
Bilirubin Total: 0.5 mg/dL (ref 0.0–1.2)
CO2: 23 mmol/L (ref 20–29)
Calcium: 9.6 mg/dL (ref 8.7–10.2)
Chloride: 101 mmol/L (ref 96–106)
Creatinine, Ser: 1.13 mg/dL (ref 0.76–1.27)
Globulin, Total: 3.1 g/dL (ref 1.5–4.5)
Glucose: 137 mg/dL — ABNORMAL HIGH (ref 70–99)
Potassium: 4.3 mmol/L (ref 3.5–5.2)
Sodium: 138 mmol/L (ref 134–144)
Total Protein: 7.5 g/dL (ref 6.0–8.5)
eGFR: 82 mL/min/{1.73_m2} (ref >59)

## 2023-07-26 LAB — LIPID PANEL
Chol/HDL Ratio: 6.9 ratio — ABNORMAL HIGH (ref 0.0–5.0)
Cholesterol, Total: 192 mg/dL (ref 100–199)
HDL: 28 mg/dL — ABNORMAL LOW (ref >39)
LDL Chol Calc (NIH): 67 mg/dL (ref 0–99)
Triglycerides: 630 mg/dL (ref 0–149)
VLDL Cholesterol Cal: 97 mg/dL — ABNORMAL HIGH (ref 5–40)

## 2023-07-26 MED ORDER — ROSUVASTATIN CALCIUM 20 MG PO TABS: 20.0000 mg | ORAL_TABLET | Freq: Every day | ORAL | 11 refills | Status: AC

## 2023-07-27 ENCOUNTER — Other Ambulatory Visit: Payer: Self-pay

## 2023-08-02 ENCOUNTER — Telehealth: Payer: Self-pay

## 2023-08-02 NOTE — Progress Notes (Signed)
   Care Guide Note  08/02/2023 Name: Derrick Black MRN: [id number] DOB: Jan 18, 1976  Referred by: Ivonne Andrew, NP Reason for referral : Care Coordination (Outreach to schedule with pharm d )   Derrick Black is a 47 y.o. year old male who is a primary care patient of Ivonne Andrew, NP. Derrick Black was referred to the pharmacist for assistance related to HLD.    Successful contact was made with the patient to discuss pharmacy services including being ready for the pharmacist to call at least 5 minutes before the scheduled appointment time, to have medication bottles and any blood sugar or blood pressure readings ready for review. The patient agreed to meet with the pharmacist via with the pharmacist via telephone visit on (date/time).  09/03/2023  Penne Lash, RMA Care Guide [facility]  [place], [place] [address] Direct Dial: [phone] [email or web address]

## 2023-08-28 ENCOUNTER — Other Ambulatory Visit: Payer: Self-pay

## 2023-08-28 ENCOUNTER — Telehealth: Payer: Self-pay | Admitting: Pharmacist

## 2023-08-28 NOTE — Progress Notes (Signed)
Patient has moved to [facility] and established with a new PCP. Appointment canceled.

## 2023-09-03 ENCOUNTER — Other Ambulatory Visit: Payer: Self-pay | Admitting: Pharmacist

## 2023-09-24 ENCOUNTER — Ambulatory Visit: Payer: Self-pay | Admitting: Nurse Practitioner

## 2023-10-24 ENCOUNTER — Ambulatory Visit: Payer: Self-pay | Admitting: Nurse Practitioner

## 2023-11-26 ENCOUNTER — Telehealth: Payer: Self-pay

## 2023-11-26 NOTE — Telephone Encounter (Signed)
Pt no showed last appointment with them. Call pt tomorrow morning and see why and see if he wants to reschedule with them or not. Then call [place] back with update

## 2023-11-26 NOTE — Telephone Encounter (Signed)
Copied from CRM [id number]. Topic: Referral - Question >> Nov 26, 2023  2:54 PM Fonda Kinder J wrote: Reason for CRM: [place] [phone] from [facility] had questions about the pt's referral and is requesting a callback
